# Patient Record
Sex: Female | Born: 1975 | Race: Black or African American | Hispanic: No | Marital: Single | Smoking: Former smoker
Health system: Southern US, Community
[De-identification: ages and names within clinical notes are randomized; demographics above are authoritative.]

## PROBLEM LIST (undated history)

## (undated) ENCOUNTER — Inpatient Hospital Stay (HOSPITAL_COMMUNITY): Payer: Self-pay

## (undated) DIAGNOSIS — IMO0002 Reserved for concepts with insufficient information to code with codable children: Secondary | ICD-10-CM

## (undated) HISTORY — PX: REPAIR IMPERFORATE ANUS / ANORECTOPLASTY: SUR1185

---

## 2007-12-26 ENCOUNTER — Emergency Department (HOSPITAL_COMMUNITY): Admission: EM | Admit: 2007-12-26 | Discharge: 2007-12-26 | Payer: Self-pay | Admitting: Emergency Medicine

## 2008-11-10 HISTORY — PX: KNEE SURGERY: SHX244

## 2009-01-04 ENCOUNTER — Encounter: Admission: RE | Admit: 2009-01-04 | Discharge: 2009-01-04 | Payer: Self-pay | Admitting: Internal Medicine

## 2009-03-22 ENCOUNTER — Encounter (INDEPENDENT_AMBULATORY_CARE_PROVIDER_SITE_OTHER): Payer: Self-pay | Admitting: General Surgery

## 2009-03-22 ENCOUNTER — Ambulatory Visit (HOSPITAL_BASED_OUTPATIENT_CLINIC_OR_DEPARTMENT_OTHER): Admission: RE | Admit: 2009-03-22 | Discharge: 2009-03-22 | Payer: Self-pay | Admitting: General Surgery

## 2010-05-16 ENCOUNTER — Encounter: Admission: RE | Admit: 2010-05-16 | Discharge: 2010-05-16 | Payer: Self-pay | Admitting: Internal Medicine

## 2011-02-18 LAB — DIFFERENTIAL
Basophils Absolute: 0 10*3/uL (ref 0.0–0.1)
Lymphocytes Relative: 23 % (ref 12–46)
Lymphs Abs: 1.6 10*3/uL (ref 0.7–4.0)
Monocytes Relative: 7 % (ref 3–12)
Neutro Abs: 4.8 10*3/uL (ref 1.7–7.7)
Neutrophils Relative %: 70 % (ref 43–77)

## 2011-02-18 LAB — ANAEROBIC CULTURE

## 2011-02-18 LAB — CBC
HCT: 43 % (ref 36.0–46.0)
MCHC: 33.7 g/dL (ref 30.0–36.0)
MCV: 92.5 fL (ref 78.0–100.0)
Platelets: 208 10*3/uL (ref 150–400)
RBC: 4.65 MIL/uL (ref 3.87–5.11)
RDW: 13.7 % (ref 11.5–15.5)
WBC: 6.9 10*3/uL (ref 4.0–10.5)

## 2011-02-18 LAB — WOUND CULTURE: Culture: NO GROWTH

## 2011-03-25 NOTE — Op Note (Signed)
[name], [name] NO.:  [id number]   MEDICAL RECORD NO.:  [id number]          PATIENT TYPE:  AMB   LOCATION:  [facility]                         FACILITY:  [facility]   PHYSICIAN:  Anselm Pancoast. Weatherly, M.D.DATE OF BIRTH:  February 20, 1976   DATE OF PROCEDURE:  03/22/2009  DATE OF DISCHARGE:                               OPERATIVE REPORT   PREOPERATIVE DIAGNOSIS:  Left breast mass, status post drainage of  abscess, left breast approximately three months earlier.   POSTOPERATIVE DIAGNOSIS:  Left breast mass, status post drainage of  abscess, left breast approximately three months earlier.   OPERATION:  Excision of left breast mass with culture.   HISTORY:  [name] is a 35 year old female who I first saw  approximately three months earlier when [facility] was [facility] with a large painful mass with a history that this had been  increasing in size for approximately two weeks.  She had seen her  regular physician who had sent her for a mammogram.  I opened the area  with a small incision at the inferior aspect of the breast and there was  a large amount of kind of creamy fluid.  The cultures grew diphtheroids.  The preliminary cultures did not grow bacteria.  She was on antibiotics.  The area appeared to heal.  The soreness resolved and on the several  follow-up visits, she has never had an obvious abscess but she has had a  little persistent thickening at the approximately 7 o'clock position in  the right breast anteriorly.  I saw her recently and the mass appeared  to be a little more prominent than when I had seen her the month earlier  and she said that yes she would have increasing pain intermittently but  had not had any actual drainage and she of course had not been on  antibiotics.  I recommended that we excise the area since I think it may  be kind of a chronic abscess.  It possibly could be fibrocystic disease  but it is in a very inferior  location and clinically it has not acted  exactly like old fibrocystic disease.  I had given her a prescription  for Keflex which she started orally approximately two nights ago since  the cultures previously were sensitive to the Keflex.  The patient is  here today and on evaluation, the mass is easily felt but may not be  quite as prominent as it was about 10 days ago when she was seen in the  office.   She was given a gram of Ancef and taken to the operative suite.  The  area had been marked on the left side and after induction of general  anesthesia and an LOA tube, the left breast was prepped with Betadine  solution and then I made a little incision actually excising the old  scar.  The underlying breast tissue was kind of separated from the  surrounding breast tissue and on the inferior aspect when we were  dissecting down under the bottom right on the chest wall, there was a  little bit of  inflammatory drainage.  Whether this is kind of like milk-  like fluid or actually abscess fluid, I cannot tell but we sent a  culture aerobically and anaerobically.  I went ahead and completely  excised the area and then the little bleeders in the breast tissue were  coagulated for hemostasis and then the little nodularity right under the  skin was removed and the specimen was sent for permanent exam.  I opened  the area or cut it in half when I was doing the cultures and this  appears to be inflammatory.  It is not really a hard mass like a cancer  and it will be sent for permanent exam.  The wound was thoroughly  irrigated.  I used a couple of 4-0 Vicryl to kind of close the dead  space and then I think I used four interrupted 4-0 Monocryls  subcuticular on the skin and then put three half inch Steri-Strips.  If  there would be a problem of more inflammatory response, I could open the  midportion of the incision but I did not place a drain.  She is a pretty  significant keloid former and we  will see her back in the office next  week.  The procedure was tolerated nicely.  A sterile occlusive dressing  was applied and she will be released after a short stay in the recovery  room.      Anselm Pancoast. Zachery Dakins, M.D.  Electronically Signed     WJW/MEDQ  D:  03/22/2009  T:  03/22/2009  Job:  [id number]   cc:   Candyce Churn. Allyne Gee, M.D.  Fax: [phone]

## 2013-07-03 ENCOUNTER — Inpatient Hospital Stay (HOSPITAL_COMMUNITY)
Admission: AD | Admit: 2013-07-03 | Discharge: 2013-07-03 | Disposition: A | Discharge status: Home / Self Care | Payer: Managed Care, Other (non HMO) | Source: Ambulatory Visit | Attending: Obstetrics and Gynecology | Admitting: Obstetrics and Gynecology

## 2013-07-03 ENCOUNTER — Inpatient Hospital Stay (HOSPITAL_COMMUNITY): Payer: Managed Care, Other (non HMO)

## 2013-07-03 ENCOUNTER — Encounter (HOSPITAL_COMMUNITY): Payer: Self-pay | Admitting: *Deleted

## 2013-07-03 DIAGNOSIS — O209 Hemorrhage in early pregnancy, unspecified: Secondary | ICD-10-CM | POA: Insufficient documentation

## 2013-07-03 DIAGNOSIS — O039 Complete or unspecified spontaneous abortion without complication: Secondary | ICD-10-CM

## 2013-07-03 HISTORY — DX: Reserved for concepts with insufficient information to code with codable children: IMO0002

## 2013-07-03 LAB — WET PREP, GENITAL

## 2013-07-03 LAB — CBC
Hemoglobin: 13.7 g/dL (ref 12.0–15.0)
MCH: 30.6 pg (ref 26.0–34.0)
Platelets: 226 10*3/uL (ref 150–400)
RBC: 4.47 MIL/uL (ref 3.87–5.11)

## 2013-07-03 LAB — URINALYSIS, ROUTINE W REFLEX MICROSCOPIC
Nitrite: NEGATIVE
Protein, ur: 30 mg/dL — AB
pH: 7 (ref 5.0–8.0)

## 2013-07-03 LAB — HCG, QUANTITATIVE, PREGNANCY: hCG, Beta Chain, Quant, S: 14053 m[IU]/mL — ABNORMAL HIGH (ref <5)

## 2013-07-03 NOTE — MAU Provider Note (Signed)
History   37yo, G1P0 at [redacted]w[redacted]d by LMP.  Pt called this morning to report 1 episode of brown bleeding on toilet paper.  Pt agreed to put a pad on and call back if sx got worse or if she started cramping.  Pt called back to report BRB on toilet paper and pad at 2pm and bleeding had continued since.  Pt reports some cramping as well and a small clot that passed the last time she went to the bathroom.   Pregnancy was confirmed at another clinic, NOB w/u 8/25.  Chief Complaint  Patient presents with  . Vaginal Bleeding    OB History   Grav Para Term Preterm Abortions TAB SAB Ect Mult Living   1               Past Medical History  Diagnosis Date  . Hx of colostomy     Past Surgical History  Procedure Laterality Date  . Repair imperforate anus / anorectoplasty      History reviewed. No pertinent family history.  History  Substance Use Topics  . Smoking status: Former Games developer  . Smokeless tobacco: Never Used  . Alcohol Use: No    Allergies: No Known Allergies  Prescriptions prior to admission  Medication Sig Dispense Refill  . Prenatal Vit-Fe Fumarate-FA (PRENATAL MULTIVITAMIN) TABS tablet Take 1 tablet by mouth every morning.        ROS: see HPI above, all other systems are negative   Physical Exam   Blood pressure 150/91, pulse 95, temperature 99.1 F (37.3 C), temperature source Oral, resp. rate 18, last menstrual period 04/16/2013.  Chest: Clear Heart: RRR Abdomen: gravid, NT Extremities: WNL  Pelvic exam: normal external genitalia, vulva, vagina, cervix, uterus and adnexa Vaginal discharge - small to moderate amount of BRB noted in the vaginal vault and appears to be originating from the cervical os.  Cervix is closed and thick  U/S - Enlarged misshapen intrauterine fluid collection could reflect an abnormal intrauterine gestational sac. A gestational sac of this size should demonstrate an embryo if viable. A possible small embryo is noted with discordant  measurement (corresponding with a gestational age of [redacted] weeks 0 day) and no cardiac activity. Findings meet definitive criteria for failed pregnancy.   Results for orders placed during the hospital encounter of 07/03/13 (from the past 24 hour(s))  URINALYSIS, ROUTINE W REFLEX MICROSCOPIC     Status: Abnormal   Collection Time    07/03/13  2:45 PM      Result Value Range   Color, Urine RED (*) YELLOW   APPearance CLOUDY (*) CLEAR   Specific Gravity, Urine 1.020  1.005 - 1.030   pH 7.0  5.0 - 8.0   Glucose, UA NEGATIVE  NEGATIVE mg/dL   Hgb urine dipstick LARGE (*) NEGATIVE   Bilirubin Urine NEGATIVE  NEGATIVE   Ketones, ur NEGATIVE  NEGATIVE mg/dL   Protein, ur 30 (*) NEGATIVE mg/dL   Urobilinogen, UA 0.2  0.0 - 1.0 mg/dL   Nitrite NEGATIVE  NEGATIVE   Leukocytes, UA TRACE (*) NEGATIVE  URINE MICROSCOPIC-ADD ON     Status: None   Collection Time    07/03/13  2:45 PM      Result Value Range   Squamous Epithelial / LPF RARE  RARE   WBC, UA 0-2  <3 WBC/hpf   RBC / HPF TOO NUMEROUS TO COUNT  <3 RBC/hpf  POCT PREGNANCY, URINE     Status: Abnormal   Collection Time  07/03/13  2:54 PM      Result Value Range   Preg Test, Ur POSITIVE (*) NEGATIVE  WET PREP, GENITAL     Status: Abnormal   Collection Time    07/03/13  3:30 PM      Result Value Range   Yeast Wet Prep HPF POC NONE SEEN  NONE SEEN   Trich, Wet Prep NONE SEEN  NONE SEEN   Clue Cells Wet Prep HPF POC RARE (*) NONE SEEN   WBC, Wet Prep HPF POC NONE SEEN  NONE SEEN  CBC     Status: Abnormal   Collection Time    07/03/13  3:44 PM      Result Value Range   WBC 13.6 (*) 4.0 - 10.5 K/uL   RBC 4.47  3.87 - 5.11 MIL/uL   Hemoglobin 13.7  12.0 - 15.0 g/dL   HCT 16.1  09.6 - 04.5 %   MCV 89.3  78.0 - 100.0 fL   MCH 30.6  26.0 - 34.0 pg   MCHC 34.3  30.0 - 36.0 g/dL   RDW 40.9  81.1 - 91.4 %   Platelets 226  150 - 400 K/uL  HCG, QUANTITATIVE, PREGNANCY     Status: Abnormal   Collection Time    07/03/13  3:44 PM       Result Value Range   hCG, Beta Chain, Quant, S 14053 (*) <5 mIU/mL      ED Course  Bleeding in early pregnancy  U/S  Labs  Discussed R&B of expectant management, surgical and medical interventions.  Pt voiced understanding and preferred to proceed with expectant management and keep scheduled appointment at [facility] tomorrow.    Discharge home with bleeding, and infection precautions.  Discussed what to expect and when to call.  F/u tomorrow at the office    Haroldine Laws CNM, MSN 07/03/2013 3:40 PM

## 2013-07-03 NOTE — MAU Note (Signed)
Pt presents with complaints of bright red vaginal bleeding that started this morning as brownish discharge. States the bleeding changed to bright red at 2 today. Denies any cramping

## 2013-07-03 NOTE — Discharge Instructions (Signed)
Miscarriage A miscarriage is the sudden loss of an unborn baby (fetus) before the 20th week of pregnancy. Most miscarriages happen in the first 3 months of pregnancy. Sometimes, it happens before a woman even knows she is pregnant. A miscarriage is also called a "spontaneous miscarriage" or "early pregnancy loss." Having a miscarriage can be an emotional experience. Talk with your caregiver about any questions you may have about miscarrying, the grieving process, and your future pregnancy plans. CAUSES   Problems with the fetal chromosomes that make it impossible for the baby to develop normally. Problems with the baby's genes or chromosomes are most often the result of errors that occur, by chance, as the embryo divides and grows. The problems are not inherited from the parents.  Infection of the cervix or uterus.   Hormone problems.   Problems with the cervix, such as having an incompetent cervix. This is when the tissue in the cervix is not strong enough to hold the pregnancy.   Problems with the uterus, such as an abnormally shaped uterus, uterine fibroids, or congenital abnormalities.   Certain medical conditions.   Smoking, drinking alcohol, or taking illegal drugs.   Trauma.  Often, the cause of a miscarriage is unknown.  SYMPTOMS   Vaginal bleeding or spotting, with or without cramps or pain.  Pain or cramping in the abdomen or lower back.  Passing fluid, tissue, or blood clots from the vagina. DIAGNOSIS  Your caregiver will perform a physical exam. You may also have an ultrasound to confirm the miscarriage. Blood or urine tests may also be ordered. TREATMENT   Sometimes, treatment is not necessary if you naturally pass all the fetal tissue that was in the uterus. If some of the fetus or placenta remains in the body (incomplete miscarriage), tissue left behind may become infected and must be removed. Usually, a dilation and curettage (D and C) procedure is performed.  During a D and C procedure, the cervix is widened (dilated) and any remaining fetal or placental tissue is gently removed from the uterus.  Antibiotic medicines are prescribed if there is an infection. Other medicines may be given to reduce the size of the uterus (contract) if there is a lot of bleeding.  If you have Rh negative blood and your baby was Rh positive, you will need a Rh immunoglobulin shot. This shot will protect any future baby from having Rh blood problems in future pregnancies. HOME CARE INSTRUCTIONS   Your caregiver may order bed rest or may allow you to continue light activity. Resume activity as directed by your caregiver.  Have someone help with home and family responsibilities during this time.   Keep track of the number of sanitary pads you use each day and how soaked (saturated) they are. Write down this information.   Do not use tampons. Do not douche or have sexual intercourse until approved by your caregiver.   Only take over-the-counter or prescription medicines for pain or discomfort as directed by your caregiver.   Do not take aspirin. Aspirin can cause bleeding.   Keep all follow-up appointments with your caregiver.   If you or your partner have problems with grieving, talk to your caregiver or seek counseling to help cope with the pregnancy loss. Allow enough time to grieve before trying to get pregnant again.  SEEK IMMEDIATE MEDICAL CARE IF:   You have severe cramps or pain in your back or abdomen.  You have a fever.  You pass large blood clots (walnut-sized   or larger) ortissue from your vagina. Save any tissue for your caregiver to inspect.   Your bleeding increases.   You have a thick, bad-smelling vaginal discharge.  You become lightheaded, weak, or you faint.   You have chills.  MAKE SURE YOU:  Understand these instructions.  Will watch your condition.  Will get help right away if you are not doing well or get  worse. Document Released: 04/22/2001 Document Revised: 04/27/2012 Document Reviewed: 12/16/2011 Exit[employer] Patient Information 2014 [employer].  

## 2013-07-04 ENCOUNTER — Encounter (HOSPITAL_COMMUNITY): Payer: Self-pay | Admitting: *Deleted

## 2013-07-04 ENCOUNTER — Inpatient Hospital Stay (HOSPITAL_COMMUNITY)
Admission: RE | Admit: 2013-07-04 | Discharge: 2013-07-04 | Disposition: A | Discharge status: Home / Self Care | Payer: Managed Care, Other (non HMO) | Source: Ambulatory Visit | Attending: Obstetrics and Gynecology | Admitting: Obstetrics and Gynecology

## 2013-07-04 ENCOUNTER — Inpatient Hospital Stay (HOSPITAL_COMMUNITY): Payer: Managed Care, Other (non HMO) | Admitting: Anesthesiology

## 2013-07-04 ENCOUNTER — Encounter (HOSPITAL_COMMUNITY)
Admission: RE | Disposition: A | Discharge status: Home / Self Care | Payer: Self-pay | Source: Ambulatory Visit | Attending: Obstetrics and Gynecology

## 2013-07-04 ENCOUNTER — Encounter (HOSPITAL_COMMUNITY): Payer: Self-pay | Admitting: Anesthesiology

## 2013-07-04 DIAGNOSIS — D259 Leiomyoma of uterus, unspecified: Secondary | ICD-10-CM | POA: Insufficient documentation

## 2013-07-04 DIAGNOSIS — O021 Missed abortion: Secondary | ICD-10-CM | POA: Insufficient documentation

## 2013-07-04 HISTORY — PX: DILATION AND EVACUATION: SHX1459

## 2013-07-04 LAB — GC/CHLAMYDIA PROBE AMP: GC Probe RNA: NEGATIVE

## 2013-07-04 SURGERY — DILATION AND EVACUATION, UTERUS
Anesthesia: Monitor Anesthesia Care | Site: Uterus | Wound class: Clean Contaminated

## 2013-07-04 MED ORDER — MIDAZOLAM HCL 2 MG/2ML IJ SOLN
INTRAMUSCULAR | Status: AC
  Filled 2013-07-04: qty 2

## 2013-07-04 MED ORDER — MIDAZOLAM HCL 5 MG/5ML IJ SOLN
INTRAMUSCULAR | Status: DC | PRN
  Administered 2013-07-04: 2 mg via INTRAVENOUS

## 2013-07-04 MED ORDER — PROPOFOL 10 MG/ML IV EMUL
INTRAVENOUS | Status: DC | PRN
  Administered 2013-07-04 (×2): 50 mg via INTRAVENOUS

## 2013-07-04 MED ORDER — PROPOFOL 10 MG/ML IV EMUL
INTRAVENOUS | Status: AC
  Filled 2013-07-04: qty 40

## 2013-07-04 MED ORDER — DEXAMETHASONE SODIUM PHOSPHATE 10 MG/ML IJ SOLN
INTRAMUSCULAR | Status: AC
  Filled 2013-07-04: qty 1

## 2013-07-04 MED ORDER — BUPIVACAINE-EPINEPHRINE 0.5% -1:200000 IJ SOLN
INTRAMUSCULAR | Status: DC | PRN
  Administered 2013-07-04: 10 mL

## 2013-07-04 MED ORDER — PHENYLEPHRINE 40 MCG/ML (10ML) SYRINGE FOR IV PUSH (FOR BLOOD PRESSURE SUPPORT)
PREFILLED_SYRINGE | INTRAVENOUS | Status: AC
  Filled 2013-07-04: qty 5

## 2013-07-04 MED ORDER — PROPOFOL INFUSION 10 MG/ML OPTIME
INTRAVENOUS | Status: DC | PRN
  Administered 2013-07-04: 120 ug/kg/min via INTRAVENOUS

## 2013-07-04 MED ORDER — SILVER NITRATE-POT NITRATE 75-25 % EX MISC
CUTANEOUS | Status: AC
  Filled 2013-07-04: qty 1

## 2013-07-04 MED ORDER — FENTANYL CITRATE 0.05 MG/ML IJ SOLN
INTRAMUSCULAR | Status: AC
  Filled 2013-07-04: qty 2

## 2013-07-04 MED ORDER — 0.9 % SODIUM CHLORIDE (POUR BTL) OPTIME
TOPICAL | Status: DC | PRN
  Administered 2013-07-04: 1000 mL

## 2013-07-04 MED ORDER — FENTANYL CITRATE 0.05 MG/ML IJ SOLN
INTRAMUSCULAR | Status: DC | PRN
  Administered 2013-07-04: 100 ug via INTRAVENOUS
  Administered 2013-07-04: 50 ug via INTRAVENOUS

## 2013-07-04 MED ORDER — KETOROLAC TROMETHAMINE 30 MG/ML IJ SOLN: 15.0000 mg | Freq: Once | INTRAMUSCULAR | Status: DC | PRN

## 2013-07-04 MED ORDER — BUPIVACAINE-EPINEPHRINE (PF) 0.5% -1:200000 IJ SOLN
INTRAMUSCULAR | Status: AC
  Filled 2013-07-04: qty 10

## 2013-07-04 MED ORDER — DEXAMETHASONE SODIUM PHOSPHATE 10 MG/ML IJ SOLN
INTRAMUSCULAR | Status: DC | PRN
  Administered 2013-07-04: 10 mg via INTRAVENOUS

## 2013-07-04 MED ORDER — FENTANYL CITRATE 0.05 MG/ML IJ SOLN: 25.0000 ug | INTRAMUSCULAR | Status: DC | PRN

## 2013-07-04 MED ORDER — LIDOCAINE HCL (CARDIAC) 20 MG/ML IV SOLN
INTRAVENOUS | Status: AC
  Filled 2013-07-04: qty 5

## 2013-07-04 MED ORDER — LIDOCAINE HCL (CARDIAC) 20 MG/ML IV SOLN
INTRAVENOUS | Status: DC | PRN
  Administered 2013-07-04: 40 mg via INTRAVENOUS

## 2013-07-04 MED ORDER — PHENYLEPHRINE HCL 10 MG/ML IJ SOLN
INTRAMUSCULAR | Status: DC | PRN
  Administered 2013-07-04 (×3): 80 ug via INTRAVENOUS
  Administered 2013-07-04: 40 ug via INTRAVENOUS

## 2013-07-04 MED ORDER — KETOROLAC TROMETHAMINE 30 MG/ML IJ SOLN
INTRAMUSCULAR | Status: DC | PRN
  Administered 2013-07-04: 30 mg via INTRAVENOUS
  Administered 2013-07-04: 30 mg via INTRAMUSCULAR

## 2013-07-04 MED ORDER — FENTANYL CITRATE 0.05 MG/ML IJ SOLN
INTRAMUSCULAR | Status: AC
  Filled 2013-07-04: qty 4

## 2013-07-04 MED ORDER — ONDANSETRON HCL 4 MG/2ML IJ SOLN
INTRAMUSCULAR | Status: AC
  Filled 2013-07-04: qty 2

## 2013-07-04 MED ORDER — ONDANSETRON HCL 4 MG/2ML IJ SOLN
INTRAMUSCULAR | Status: DC | PRN
  Administered 2013-07-04: 4 mg via INTRAVENOUS

## 2013-07-04 MED ORDER — ONDANSETRON HCL 4 MG/2ML IJ SOLN: 4.0000 mg | Freq: Once | INTRAMUSCULAR | Status: DC | PRN

## 2013-07-04 MED ORDER — MEPERIDINE HCL 25 MG/ML IJ SOLN: 6.2500 mg | INTRAMUSCULAR | Status: DC | PRN

## 2013-07-04 MED ORDER — LACTATED RINGERS IV SOLN
INTRAVENOUS | Status: DC
  Administered 2013-07-04 (×2): via INTRAVENOUS

## 2013-07-04 SURGICAL SUPPLY — 20 items
CATH ROBINSON RED A/P 16FR (CATHETERS) ×2 IMPLANT
CLOTH BEACON ORANGE TIMEOUT ST (SAFETY) ×2 IMPLANT
DECANTER SPIKE VIAL GLASS SM (MISCELLANEOUS) ×2 IMPLANT
GLOVE BIOGEL PI IND STRL 8.5 (GLOVE) ×1 IMPLANT
GLOVE BIOGEL PI INDICATOR 8.5 (GLOVE) ×1
GLOVE ECLIPSE 8.0 STRL XLNG CF (GLOVE) ×4 IMPLANT
GOWN STRL REIN XL XLG (GOWN DISPOSABLE) ×4 IMPLANT
KIT BERKELEY 1ST TRIMESTER 3/8 (MISCELLANEOUS) ×2 IMPLANT
NEEDLE SPNL 22GX3.5 QUINCKE BK (NEEDLE) ×2 IMPLANT
NS IRRIG 1000ML POUR BTL (IV SOLUTION) ×2 IMPLANT
PACK VAGINAL MINOR WOMEN LF (CUSTOM PROCEDURE TRAY) ×2 IMPLANT
PAD OB MATERNITY 4.3X12.25 (PERSONAL CARE ITEMS) ×2 IMPLANT
PAD PREP 24X48 CUFFED NSTRL (MISCELLANEOUS) ×2 IMPLANT
SET BERKELEY SUCTION TUBING (SUCTIONS) ×2 IMPLANT
SYR CONTROL 10ML LL (SYRINGE) ×2 IMPLANT
TOWEL OR 17X24 6PK STRL BLUE (TOWEL DISPOSABLE) ×4 IMPLANT
VACURETTE 10 RIGID CVD (CANNULA) IMPLANT
VACURETTE 7MM CVD STRL WRAP (CANNULA) IMPLANT
VACURETTE 8 RIGID CVD (CANNULA) ×2 IMPLANT
VACURETTE 9 RIGID CVD (CANNULA) IMPLANT

## 2013-07-04 NOTE — H&P (Signed)
  Admission History and Physical Exam for a Gynecology Patient  [name] is a 37 y.o. female, G1P0, who presents for a D and E in the first trimester because of a missed abortion. She has been followed at the [facility]cology division of [facility]. Nonviability was confirmed by [place].     OB History   Grav Para Term Preterm Abortions TAB SAB Ect Mult Living   1               Past Medical History  Diagnosis Date  . Hx of colostomy     No prescriptions prior to admission    Past Surgical History  Procedure Laterality Date  . Repair imperforate anus / anorectoplasty      No Known Allergies  Family History: family history is not on file.  Social History:  reports that she has quit smoking. She has never used smokeless tobacco. She reports that she does not drink alcohol or use illicit drugs.  Review of systems: See HPI.  Admission Physical Exam:    There is no height or weight on file to calculate BMI.  Last menstrual period 04/16/2013.  HEENT:                 Within normal limits Chest:                   Clear Heart:                    Regular rate and rhythm Breasts:                No masses, skin changes, bleeding, or discharge present Abdomen:             Nontender, no masses Extremities:          Grossly normal Neurologic exam: Grossly normal  Pelvic exam:  External genitalia: normal general appearance Vaginal: normal without tenderness, induration or masses Cervix: normal appearance Adnexa: normal bimanual exam Uterus: enlarged  Assessment:  First trimester missed abortion  Plan:  D and E. R and B discussed.  Blood type O+.   Janine Limbo 07/04/2013

## 2013-07-04 NOTE — Transfer of Care (Signed)
Immediate Anesthesia Transfer of Care Note  Patient: [name]  Procedure(s) Performed: Procedure(s): DILATATION AND EVACUATION (N/A)  Patient Location: PACU  Anesthesia Type:MAC  Level of Consciousness: awake, alert  and oriented  Airway & Oxygen Therapy: Patient Spontanous Breathing and Patient connected to nasal cannula oxygen  Post-op Assessment: Report given to PACU RN and Post -op Vital signs reviewed and stable  Post vital signs: Reviewed and stable  Complications: No apparent anesthesia complications

## 2013-07-04 NOTE — Op Note (Addendum)
OPERATIVE NOTE  [name]  DOB:    September 27, 1976  MRN:    [id number]  CSN:    [id number]  Date of Surgery:  07/04/2013  Preoperative Diagnosis:  Missed Abortion at [redacted]w[redacted]d Gestation  Postoperative Diagnosis:  Missed Abortion at 110w2d Gestation Fibroids  Procedure:  FIRST TRIMESTER SUCTION DILATATION AND EVACUATION  Surgeon:  Leonard Schwartz, M.D.  Assistant:  None  Anesthetic:  MAC  Disposition:  The patient is a 37 y.o. year old female who presents at [redacted]w[redacted]d gestation with a nonviable gestation based on ultrasound and/or quantitative hCG values. She understands the indications for her surgical procedure as well as the alternative treatment options. She accepts the risk of, but not limited to, anesthetic complications, bleeding, infections, and possible damage to the surrounding organs.  Findings:  The patient's blood type is O+. A moderate amount of products of conception were removed from within a eight weeks size uterus. No adnexal masses were appreciated. I suspect that the patient has a two cm submucosal fibroid.  Procedure:  The patient was taken to the operating room where a MAC anesthetic was given. The patient's abdomen, perineum, and vagina were prepped with Betadine. The bladder was drained of urine. The patient was sterilely draped. Examination under anesthesia was performed. Tissue was noted from a partially open cervix. A paracervical block was placed using 10 cc of half percent Marcaine with epinephrine. The cervix was gently dilated. The uterine cavity was evacuated using a size eight suction curet. The cavity was then cleaned using a medium sharp curet. The cavity was felt to be clean at the end of our procedure. The estimated blood loss was 15 cc's. The uterus was reexamined and was noted to be firm. Hemostasis was adequate. Sponge and needle counts were correct. The patient tolerated her but her procedure well. She was awakened from her  anesthetic without difficulty. She was transported to the recovery room in stable condition. The products of conception were sent to pathology.  Followup instructions:  The patient return to see Dr. Stefano Gaul in 2 weeks. She was given prescriptions for pain medications previously. She was given instructions for patients who've undergone the surgical procedure. She will call for questions or concerns.  Leonard Schwartz, M.D.

## 2013-07-04 NOTE — Anesthesia Postprocedure Evaluation (Signed)
Anesthesia Post Note  Patient: [name]  Procedure(s) Performed: Procedure(s) (LRB): DILATATION AND EVACUATION (N/A)  Anesthesia type: MAC  Patient location: PACU  Post pain: Pain level controlled  Post assessment: Post-op Vital signs reviewed  Last Vitals:  Filed Vitals:   07/04/13 1830  BP:   Pulse: 99  Temp:   Resp: 22    Post vital signs: Reviewed  Level of consciousness: sedated  Complications: No apparent anesthesia complications

## 2013-07-04 NOTE — Anesthesia Preprocedure Evaluation (Signed)
Anesthesia Evaluation  Patient identified by MRN, date of birth, ID band Patient awake    Reviewed: Allergy & Precautions, H&P , NPO status , Patient's Chart, lab work & pertinent test results  Airway Mallampati: I TM Distance: >3 FB Neck ROM: full    Dental no notable dental hx.    Pulmonary neg pulmonary ROS,    Pulmonary exam normal       Cardiovascular hypertension,     Neuro/Psych negative neurological ROS  negative psych ROS   GI/Hepatic negative GI ROS, Neg liver ROS,   Endo/Other  negative endocrine ROS  Renal/GU negative Renal ROS     Musculoskeletal negative musculoskeletal ROS (+)   Abdominal Normal abdominal exam  (+)   Peds  Hematology negative hematology ROS (+)   Anesthesia Other Findings   Reproductive/Obstetrics (+) Pregnancy                           Anesthesia Physical Anesthesia Plan  ASA: II  Anesthesia Plan: MAC   Post-op Pain Management:    Induction:   Airway Management Planned:   Additional Equipment:   Intra-op Plan:   Post-operative Plan:   Informed Consent: I have reviewed the patients History and Physical, chart, labs and discussed the procedure including the risks, benefits and alternatives for the proposed anesthesia with the patient or authorized representative who has indicated his/her understanding and acceptance.     Plan Discussed with: CRNA and Surgeon  Anesthesia Plan Comments:         Anesthesia Quick Evaluation

## 2013-07-04 NOTE — Anesthesia Procedure Notes (Signed)
Procedure Name: MAC Date/Time: 07/04/2013 5:42 PM Performed by: Gwendlyn Hanback, Jannet Askew Pre-anesthesia Checklist: Patient identified, Patient being monitored, Emergency Drugs available and Timeout performed Oxygen Delivery Method: Nasal cannula Preoxygenation: Pre-oxygenation with 100% oxygen

## 2013-07-05 ENCOUNTER — Encounter (HOSPITAL_COMMUNITY): Payer: Self-pay | Admitting: Obstetrics and Gynecology

## 2014-05-08 ENCOUNTER — Encounter (HOSPITAL_COMMUNITY): Payer: Self-pay | Admitting: *Deleted

## 2014-09-11 ENCOUNTER — Encounter (HOSPITAL_COMMUNITY): Payer: Self-pay | Admitting: *Deleted

## 2014-10-13 LAB — OB RESULTS CONSOLE HEPATITIS B SURFACE ANTIGEN: Hepatitis B Surface Ag: NEGATIVE

## 2014-10-13 LAB — OB RESULTS CONSOLE GC/CHLAMYDIA
Chlamydia: NEGATIVE
Gonorrhea: NEGATIVE

## 2014-10-13 LAB — OB RESULTS CONSOLE RPR: RPR: NONREACTIVE

## 2014-10-13 LAB — OB RESULTS CONSOLE ABO/RH: RH TYPE: POSITIVE

## 2014-10-13 LAB — OB RESULTS CONSOLE ANTIBODY SCREEN: Antibody Screen: NEGATIVE

## 2014-10-13 LAB — OB RESULTS CONSOLE RUBELLA ANTIBODY, IGM: RUBELLA: IMMUNE

## 2014-10-13 LAB — OB RESULTS CONSOLE HIV ANTIBODY (ROUTINE TESTING): HIV: NONREACTIVE

## 2015-04-27 LAB — OB RESULTS CONSOLE GBS: STREP GROUP B AG: POSITIVE

## 2015-05-21 ENCOUNTER — Inpatient Hospital Stay (HOSPITAL_COMMUNITY)
Admission: AD | Admit: 2015-05-21 | Discharge: 2015-05-26 | DRG: 765 | Disposition: A | Discharge status: Home / Self Care | Payer: Managed Care, Other (non HMO) | Source: Ambulatory Visit | Attending: Obstetrics and Gynecology | Admitting: Obstetrics and Gynecology

## 2015-05-21 ENCOUNTER — Encounter (HOSPITAL_COMMUNITY): Payer: Self-pay | Admitting: *Deleted

## 2015-05-21 DIAGNOSIS — Z823 Family history of stroke: Secondary | ICD-10-CM

## 2015-05-21 DIAGNOSIS — O09529 Supervision of elderly multigravida, unspecified trimester: Secondary | ICD-10-CM | POA: Diagnosis not present

## 2015-05-21 DIAGNOSIS — K632 Fistula of intestine: Secondary | ICD-10-CM | POA: Diagnosis not present

## 2015-05-21 DIAGNOSIS — D259 Leiomyoma of uterus, unspecified: Secondary | ICD-10-CM | POA: Diagnosis present

## 2015-05-21 DIAGNOSIS — Z3A39 39 weeks gestation of pregnancy: Secondary | ICD-10-CM | POA: Diagnosis present

## 2015-05-21 DIAGNOSIS — O99824 Streptococcus B carrier state complicating childbirth: Secondary | ICD-10-CM | POA: Diagnosis present

## 2015-05-21 DIAGNOSIS — Z98891 History of uterine scar from previous surgery: Secondary | ICD-10-CM

## 2015-05-21 DIAGNOSIS — Z6829 Body mass index (BMI) 29.0-29.9, adult: Secondary | ICD-10-CM

## 2015-05-21 DIAGNOSIS — E669 Obesity, unspecified: Secondary | ICD-10-CM | POA: Diagnosis present

## 2015-05-21 DIAGNOSIS — O9081 Anemia of the puerperium: Secondary | ICD-10-CM | POA: Diagnosis not present

## 2015-05-21 DIAGNOSIS — Z87891 Personal history of nicotine dependence: Secondary | ICD-10-CM

## 2015-05-21 DIAGNOSIS — D649 Anemia, unspecified: Secondary | ICD-10-CM | POA: Diagnosis not present

## 2015-05-21 DIAGNOSIS — B951 Streptococcus, group B, as the cause of diseases classified elsewhere: Secondary | ICD-10-CM | POA: Diagnosis present

## 2015-05-21 DIAGNOSIS — O4593 Premature separation of placenta, unspecified, third trimester: Secondary | ICD-10-CM | POA: Diagnosis present

## 2015-05-21 DIAGNOSIS — O99214 Obesity complicating childbirth: Secondary | ICD-10-CM | POA: Diagnosis present

## 2015-05-21 DIAGNOSIS — D219 Benign neoplasm of connective and other soft tissue, unspecified: Secondary | ICD-10-CM | POA: Diagnosis not present

## 2015-05-21 DIAGNOSIS — O1413 Severe pre-eclampsia, third trimester: Secondary | ICD-10-CM | POA: Diagnosis present

## 2015-05-21 DIAGNOSIS — Z6828 Body mass index (BMI) 28.0-28.9, adult: Secondary | ICD-10-CM

## 2015-05-21 DIAGNOSIS — Z87738 Personal history of other specified (corrected) congenital malformations of digestive system: Secondary | ICD-10-CM

## 2015-05-21 DIAGNOSIS — O3413 Maternal care for benign tumor of corpus uteri, third trimester: Secondary | ICD-10-CM | POA: Diagnosis present

## 2015-05-21 DIAGNOSIS — O1410 Severe pre-eclampsia, unspecified trimester: Secondary | ICD-10-CM | POA: Diagnosis present

## 2015-05-21 LAB — CBC
HCT: 36.8 % (ref 36.0–46.0)
HCT: 36.8 % (ref 36.0–46.0)
Hemoglobin: 12 g/dL (ref 12.0–15.0)
Hemoglobin: 12.1 g/dL (ref 12.0–15.0)
MCH: 28.6 pg (ref 26.0–34.0)
MCH: 28.6 pg (ref 26.0–34.0)
MCHC: 32.6 g/dL (ref 30.0–36.0)
MCHC: 32.9 g/dL (ref 30.0–36.0)
MCV: 87 fL (ref 78.0–100.0)
MCV: 87.6 fL (ref 78.0–100.0)
Platelets: 176 10*3/uL (ref 150–400)
Platelets: 191 10*3/uL (ref 150–400)
RBC: 4.2 MIL/uL (ref 3.87–5.11)
RBC: 4.23 MIL/uL (ref 3.87–5.11)
RDW: 15.4 % (ref 11.5–15.5)
RDW: 15.6 % — ABNORMAL HIGH (ref 11.5–15.5)
WBC: 13.7 10*3/uL — ABNORMAL HIGH (ref 4.0–10.5)
WBC: 17.6 10*3/uL — ABNORMAL HIGH (ref 4.0–10.5)

## 2015-05-21 LAB — URINALYSIS, ROUTINE W REFLEX MICROSCOPIC
Bilirubin Urine: NEGATIVE
Glucose, UA: NEGATIVE mg/dL
Ketones, ur: NEGATIVE mg/dL
NITRITE: NEGATIVE
Protein, ur: NEGATIVE mg/dL
Urobilinogen, UA: 0.2 mg/dL (ref 0.0–1.0)
pH: 5.5 (ref 5.0–8.0)

## 2015-05-21 LAB — PROTEIN / CREATININE RATIO, URINE
CREATININE, URINE: 26 mg/dL
Total Protein, Urine: <6 mg/dL

## 2015-05-21 LAB — COMPREHENSIVE METABOLIC PANEL
ALK PHOS: 261 U/L — AB (ref 38–126)
ALT: 25 U/L (ref 14–54)
AST: 37 U/L (ref 15–41)
Albumin: 2.9 g/dL — ABNORMAL LOW (ref 3.5–5.0)
Anion gap: 7 (ref 5–15)
BUN: 15 mg/dL (ref 6–20)
CO2: 18 mmol/L — ABNORMAL LOW (ref 22–32)
Calcium: 9.2 mg/dL (ref 8.9–10.3)
Chloride: 110 mmol/L (ref 101–111)
Creatinine, Ser: 1.3 mg/dL — ABNORMAL HIGH (ref 0.44–1.00)
GFR calc non Af Amer: 51 mL/min — ABNORMAL LOW (ref >60)
GFR, EST AFRICAN AMERICAN: 59 mL/min — AB (ref >60)
Glucose, Bld: 80 mg/dL (ref 65–99)
POTASSIUM: 4.2 mmol/L (ref 3.5–5.1)
SODIUM: 135 mmol/L (ref 135–145)
Total Bilirubin: 0.5 mg/dL (ref 0.3–1.2)
Total Protein: 6.4 g/dL — ABNORMAL LOW (ref 6.5–8.1)

## 2015-05-21 LAB — LACTATE DEHYDROGENASE: LDH: 237 U/L — ABNORMAL HIGH (ref 98–192)

## 2015-05-21 LAB — URINE MICROSCOPIC-ADD ON

## 2015-05-21 LAB — URIC ACID: Uric Acid, Serum: 8.1 mg/dL — ABNORMAL HIGH (ref 2.3–6.6)

## 2015-05-21 MED ORDER — LACTATED RINGERS IV SOLN
INTRAVENOUS | Status: DC
  Administered 2015-05-21 – 2015-05-23 (×4): via INTRAVENOUS

## 2015-05-21 MED ORDER — TERBUTALINE SULFATE 1 MG/ML IJ SOLN: 0.2500 mg | Freq: Once | INTRAMUSCULAR | Status: DC | PRN

## 2015-05-21 MED ORDER — HYDRALAZINE HCL 20 MG/ML IJ SOLN: 10.0000 mg | Freq: Once | INTRAMUSCULAR | Status: DC | PRN

## 2015-05-21 MED ORDER — OXYCODONE-ACETAMINOPHEN 5-325 MG PO TABS: 2.0000 | ORAL_TABLET | ORAL | Status: DC | PRN

## 2015-05-21 MED ORDER — HYDRALAZINE HCL 20 MG/ML IJ SOLN: 10.0000 mg | Freq: Once | INTRAMUSCULAR | Status: AC | PRN

## 2015-05-21 MED ORDER — PENICILLIN G POTASSIUM 5000000 UNITS IJ SOLR
5.0000 10*6.[IU] | Freq: Once | INTRAVENOUS | Status: AC
  Administered 2015-05-21: 5 10*6.[IU] via INTRAVENOUS
  Filled 2015-05-21: qty 5

## 2015-05-21 MED ORDER — ONDANSETRON HCL 4 MG/2ML IJ SOLN
4.0000 mg | Freq: Four times a day (QID) | INTRAMUSCULAR | Status: DC | PRN
  Administered 2015-05-23: 4 mg via INTRAVENOUS

## 2015-05-21 MED ORDER — FENTANYL CITRATE (PF) 100 MCG/2ML IJ SOLN
100.0000 ug | INTRAMUSCULAR | Status: DC | PRN
Start: 2015-05-21 — End: 2015-05-23
  Administered 2015-05-22 (×3): 100 ug via INTRAVENOUS
  Filled 2015-05-21 (×3): qty 2

## 2015-05-21 MED ORDER — LACTATED RINGERS IV SOLN
INTRAVENOUS | Status: DC
  Administered 2015-05-21 – 2015-05-23 (×2): via INTRAVENOUS

## 2015-05-21 MED ORDER — LACTATED RINGERS IV SOLN
1.0000 g/h | INTRAVENOUS | Status: DC
  Administered 2015-05-22: 1 g/h via INTRAVENOUS
  Filled 2015-05-21 (×2): qty 80

## 2015-05-21 MED ORDER — ACETAMINOPHEN 325 MG PO TABS: 650.0000 mg | ORAL_TABLET | ORAL | Status: DC | PRN

## 2015-05-21 MED ORDER — FLEET ENEMA 7-19 GM/118ML RE ENEM: 1.0000 | ENEMA | RECTAL | Status: DC | PRN

## 2015-05-21 MED ORDER — MISOPROSTOL 25 MCG QUARTER TABLET: 25.0000 ug | ORAL_TABLET | ORAL | Status: DC | PRN

## 2015-05-21 MED ORDER — TERBUTALINE SULFATE 1 MG/ML IJ SOLN: 0.2500 mg | Freq: Once | INTRAMUSCULAR | Status: AC | PRN

## 2015-05-21 MED ORDER — HYDRALAZINE HCL 20 MG/ML IJ SOLN
10.0000 mg | Freq: Once | INTRAMUSCULAR | Status: AC | PRN
  Administered 2015-05-21: 10 mg via INTRAVENOUS
  Filled 2015-05-21: qty 1

## 2015-05-21 MED ORDER — MAGNESIUM SULFATE BOLUS VIA INFUSION
4.0000 g | Freq: Once | INTRAVENOUS | Status: AC
Start: 2015-05-21 — End: 2015-05-21
  Administered 2015-05-21: 4 g via INTRAVENOUS
  Filled 2015-05-21: qty 500

## 2015-05-21 MED ORDER — OXYTOCIN BOLUS FROM INFUSION: 500.0000 mL | INTRAVENOUS | Status: DC

## 2015-05-21 MED ORDER — CITRIC ACID-SODIUM CITRATE 334-500 MG/5ML PO SOLN
30.0000 mL | ORAL | Status: DC | PRN
  Administered 2015-05-23: 30 mL via ORAL
  Filled 2015-05-21: qty 15

## 2015-05-21 MED ORDER — OXYTOCIN 40 UNITS IN LACTATED RINGERS INFUSION - SIMPLE MED
1.0000 m[IU]/min | INTRAVENOUS | Status: DC
  Administered 2015-05-21: 1 m[IU]/min via INTRAVENOUS
  Filled 2015-05-21: qty 1000

## 2015-05-21 MED ORDER — LABETALOL HCL 5 MG/ML IV SOLN
20.0000 mg | INTRAVENOUS | Status: AC | PRN
  Administered 2015-05-21: 20 mg via INTRAVENOUS
  Administered 2015-05-21: 80 mg via INTRAVENOUS
  Administered 2015-05-21: 40 mg via INTRAVENOUS
  Filled 2015-05-21: qty 16
  Filled 2015-05-21: qty 8
  Filled 2015-05-21: qty 4

## 2015-05-21 MED ORDER — LIDOCAINE HCL (PF) 1 % IJ SOLN: 30.0000 mL | INTRAMUSCULAR | Status: DC | PRN

## 2015-05-21 MED ORDER — OXYTOCIN 40 UNITS IN LACTATED RINGERS INFUSION - SIMPLE MED
62.5000 mL/h | INTRAVENOUS | Status: DC
  Filled 2015-05-21: qty 1000

## 2015-05-21 MED ORDER — OXYCODONE-ACETAMINOPHEN 5-325 MG PO TABS: 1.0000 | ORAL_TABLET | ORAL | Status: DC | PRN

## 2015-05-21 MED ORDER — PENICILLIN G POTASSIUM 5000000 UNITS IJ SOLR
2.5000 10*6.[IU] | INTRAVENOUS | Status: DC
  Administered 2015-05-22 – 2015-05-23 (×9): 2.5 10*6.[IU] via INTRAVENOUS
  Filled 2015-05-21 (×13): qty 2.5

## 2015-05-21 MED ORDER — LACTATED RINGERS IV SOLN
500.0000 mL | INTRAVENOUS | Status: DC | PRN
  Administered 2015-05-23: 500 mL via INTRAVENOUS

## 2015-05-21 MED ORDER — LABETALOL HCL 5 MG/ML IV SOLN: 20.0000 mg | INTRAVENOUS | Status: DC | PRN

## 2015-05-21 NOTE — MAU Note (Signed)
Patient presents at [redacted] weeks gestation with c/o contractions 3-5 minutes apart since 1500 today. Fetus active. Denies bleeding but has seen bloody show.

## 2015-05-21 NOTE — Progress Notes (Addendum)
  Subjective: Uncomfortable w/ ctxs, but bearable. Declines the need for pain medication at this time. Thinks she may have SROM'd this morning at 7 am.  Denies h/a, visual changes, epigastric pain or difficulty breathing.  Mother and significant other at bedside.   Objective: BP 138/83 mmHg  Pulse 101  Temp(Src) 98.1 F (36.7 C) (Oral)  Resp 18  Ht 5\' 5"  (1.651 m)  Wt 78.926 kg (174 lb)  BMI 28.96 kg/m2  LMP 08/18/2014     Today's Vitals   05/21/15 2002 05/21/15 2056 05/21/15 2105 05/21/15 2126  BP: 139/79  138/83 149/86  Pulse: 95  101 105  Temp: 98.1 F (36.7 C)     TempSrc: Oral     Resp: 18  18 18   Height:      Weight:      PainSc:  8   8    FHT: BL 140s w/ moderate variability, 10x10 accels, no decels UC:   Irregular -- started out q 2-6 min, now more spaced out SVE:   Dilation: 3 Effacement (%): 50 Station: -3 Exam by:: Farrel Gordon, CNM  Bishop score: 2 Cephalic by VE; sutures palpable Pelvis feels adequate: EFW: 7 1/2 lbs  Assessment:  39 yo G2P0010 @ 39.3 wks  IOL due to preE with severe features Stable BPs currently; has not needed coverage since 1847  GBS positive AMA Early labor ? ROM x 14+ hrs; no evidence of infection PCR too low to calculate  Plan: Consulted Dr. Cletis Media. Will begin 1) Pitocin, 2) Magnesium Sulfate, and 3) PCN G for GBS prophylaxis. Mode of induction reiterated. Vigilant for placenta abruption. Continue Magnesium Sulfate throughout labor until 24 hrs after delivery. Anticipate progress and SVD. Consult prn. Pain med/epidural as desired.  Farrel Gordon CNM 05/21/2015, 9:27 PM

## 2015-05-21 NOTE — H&P (Signed)
[name] is a 39 y.o. female, G2P0010 at 54 3/7 weeks, presenting for contractions of increasing frequency and intensity throughout the day, now q 5 min, with bloody show.Denies HA, visual sx, or epigastric pain, reports +FM.No recent cervical exam.   Patient Active Problem List   Diagnosis Date Noted  . Preeclampsia with severe features 05/21/2015  . Positive GBS test 05/21/2015  . AMA (advanced maternal age) multigravida 35+ 05/21/2015  . Fibroids 05/21/2015  . History of imperforate anus--surgery 1976 05/21/2015  . Fistula of intestine--repaired 2007 05/21/2015                                                                                          History of present pregnancy: Patient entered care at 10.5 weeks.  EDC of 05/25/15 was established by u/s at 11.6 wks.  Anatomy scan: 20.0 weeks, Boy. Vertex. Anterior. Vertical pocket 4.3 cm. FIBROIDS SEEN INTRAMURAL SIZES 2CM TO 5CM. LOW LYING PLACENTA 0.96 CM FROM OS. RVOT not well seen d/t position.  Additional [place] evaluations: Dating U/S. Singleton IUP with + FHT's. Anteverted Ut. 2 intramural fibroids are seen. 1.) L anterior 3.9cm x 3.3cm x 3.4 cm 2.) L anterior 3.1 cm x 2.7 cm x 2.3 cm . Note: There is a [place] L uterine cavity measuring 3.0 cm x 1.7 cm x 1.9 cm. Pt reports no bleeding. CRL = GA of [redacted]w[redacted]d. This is 8 days greater than LMP GA and by ACOG dating criteria, EDD is best by U/S. EDD is 05/25/15. Ovaries and adnexas wnl.  12.5 wks - first trimester screen: Singleton pregnancy. Anteverted uterus. Placenta appears anterior. NT = 1.1 mm, amnion seen. Adnexas WNLs. Fibroids -- two left anterior. Sherrill = 3.0 cm x 1.7 cm x 1.9 cm. Technically difficult scan due to abdominal scars. 23.6 wks - f/u anatomy: Single. Vertex. Anterior placenta - Placenta is 4.7 cm from internal OS normal. Fluid is normal. Vertical pocket 4.3 cm. Cx closed.l [place] WNL. RVOT seen Anatomy complete.  Fibroids unchanged - see  previous report for measurements . 37.6 wks - BPP/presentation: Singleton pregnancy. Vertex presentation. Anterior placenta. AFI normal - 50th%tile. BPP 8/8 in 1 minute. Adnexas unremarkable. Previously seen fibroids cannot be seen on today's scan. Significant prenatal events:Fibroids/SCH noted on dating scan; EDD changed. First trimester screen ordered due to h/o imperferated anus. Sinus issues in 2nd trimester. Low lying placenta on anatomy scan - resolved. Fall at 23.6 wks - no significant findings/issues in relationship. Normal pregnancy discomforts in 3rd trimester. Received Tdap on 03/29/15. AMA - BPPs WNL. Transverse lie at 36.6 wks, vtx 1 wk later; remained vtx. 17-lb TWG.  Last evaluation:Office 05/18/15 @ 39.0 wks by SDJ, CNM. Vertex. FHR 130 bpm. BP 130/80. Wt: 174 lbs. S<D -- FH 36 cm.  OB History    Gravida Para Term Preterm AB TAB SAB Ectopic Multiple Living   2    1  1    0    2014--SAB at 10 3/7 weeks, D&C  Past Medical History  Diagnosis Date  . Hx of colostomy    Past Surgical History  Procedure Laterality Date  . Repair imperforate anus /  anorectoplasty    . Dilation and evacuation N/A 07/04/2013    Procedure: DILATATION AND EVACUATION; Surgeon: Ena Dawley, MD; Location: [facility]; Service: Gynecology; Laterality: N/A;   Family History: family history includes Cancer in her maternal grandmother; Heart disease in her maternal grandfather; Hypertension in her maternal grandfather, maternal grandmother, mother, and paternal grandfather; Stroke in her paternal grandfather.   Social History:  reports that she has quit smoking. She has never used smokeless tobacco. She reports that she does not drink alcohol or use illicit drugs. Patient is Sales promotion account executive, of the [employer], has some college, employed as Investment banker, operational, single, with FOB involved and supportive. She is also accompanied by her mother.     Prenatal Transfer Tool  Maternal Diabetes: No Genetic Screening: Normal Panorama, 1st trimester screen, and AFP Maternal Ultrasounds/Referrals: Normal except for fibroids Fetal Ultrasounds or other Referrals: None Maternal Substance Abuse: No Significant Maternal Medications:PNVs Significant Maternal Lab Results: Lab values include: Group B Strep positive  TDAP 03/29/15 Flu NA  ROS: UCs, +FM  No Known Allergies   Dilation: Closed Effacement (%): Thick Exam by:: Elder Negus, CNM Blood pressure 134/92, pulse 98, temperature 98.1 F (36.7 C), temperature source Oral, resp. rate 18, height 5\' 5"  (1.651 m), weight 78.926 kg (174 lb), last menstrual period 08/18/2014, unknown if currently breastfeeding.  Chest clear Heart RRR without murmur Abd gravid, NT, FH 37 cm Pelvic: As above, with small amount dark bloody show. Vtx to bedside US Ext: DTR 2+, 1+ edema, 2 beats clonus bilaterally.  FHR: Category 1 UCs: q 4-5 min, mild.  Prenatal labs: ABO, Rh: O positive (10/13/14)  Antibody:  Neg (10/13/14) Rubella:Immune (10/13/14)   RPR: NR (10/13/14)   HBsAg: Neg (10/13/14) HIV: NR (10/13/14)  GBS: Positive (04/27/15)  Sickle cell/Hgb electrophoresis:No record Pap:Neg on 08/29/14 GC:Neg on 10/13/14 Chlamydia:Neg on 10/13/14 Genetic screenings:Neg first trimester. Low risk Panorama on 11/09/14. Neg AFP. Glucola:Normal at 104 Other:>100K enterococcus species - treated w/ Macrobid, normal Vit D Hgb 12.2 at NOB, 11.3 at 28 weeks  Assessment/Plan: IUP at 39 3/7 weeks Early vs prodromal labor PreE GBS positive AMA Hx significant abdominal surgery as infant Intramural fibroids   Plan: Admit to [place] per consult with Dr. Cletis Media. Labetalol IV x 3 doses, then 10 mg Apresoline given in MAU as Labetalol ineffective. Plan recheck of cervix after admission to [facility] no change, plan initiation of induction, with method depending on maternal/fetal  status at that time. Reviewed plan of care with patient and family--advised them if BP was unable to be controlled, or if maternal/fetal status deteriorated, C/S may be necessary, although our goal is vaginal delivery. Patient and family seem to understand the situation and are agreeable with the plan of care.   Farrel Gordon CNM, MS 05/21/2015, 7:05 PM     ADDENDUM:  Results for orders placed or performed during the hospital encounter of 05/21/15 (from the past 24 hour(s))  Protein / creatinine ratio, urine     Status: None   Collection Time: 05/21/15  4:55 PM  Result Value Ref Range   Creatinine, Urine 26.00 mg/dL   Total Protein, Urine <6 mg/dL   Protein Creatinine Ratio        0.00 - 0.15 mg/mg[Cre]  Urinalysis, Routine w reflex microscopic (not at [facility])     Status: Abnormal   Collection Time: 05/21/15  4:55 PM  Result Value Ref Range   Color, Urine YELLOW YELLOW   APPearance CLEAR CLEAR  Specific Gravity, Urine <1.005 (L) 1.005 - 1.030   pH 5.5 5.0 - 8.0   Glucose, UA NEGATIVE NEGATIVE mg/dL   Hgb urine dipstick LARGE (A) NEGATIVE   Bilirubin Urine NEGATIVE NEGATIVE   Ketones, ur NEGATIVE NEGATIVE mg/dL   Protein, ur NEGATIVE NEGATIVE mg/dL   Urobilinogen, UA 0.2 0.0 - 1.0 mg/dL   Nitrite NEGATIVE NEGATIVE   Leukocytes, UA SMALL (A) NEGATIVE  Urine microscopic-add on     Status: Abnormal   Collection Time: 05/21/15  4:55 PM  Result Value Ref Range   Squamous Epithelial / LPF RARE RARE   WBC, UA 0-2 <3 WBC/hpf   RBC / HPF 0-2 <3 RBC/hpf   Bacteria, UA FEW (A) RARE  Comprehensive metabolic panel     Status: Abnormal   Collection Time: 05/21/15  5:46 PM  Result Value Ref Range   Sodium 135 135 - 145 mmol/L   Potassium 4.2 3.5 - 5.1 mmol/L   Chloride 110 101 - 111 mmol/L   CO2 18 (L) 22 - 32 mmol/L   Glucose, Bld 80 65 - 99 mg/dL   BUN 15 6 - 20 mg/dL   Creatinine, Ser 1.30 (H) 0.44 - 1.00 mg/dL   Calcium 9.2 8.9 - 10.3 mg/dL   Total Protein 6.4 (L) 6.5 - 8.1  g/dL   Albumin 2.9 (L) 3.5 - 5.0 g/dL   AST 37 15 - 41 U/L   ALT 25 14 - 54 U/L   Alkaline Phosphatase 261 (H) 38 - 126 U/L   Total Bilirubin 0.5 0.3 - 1.2 mg/dL   GFR calc non Af Amer 51 (L) >60 mL/min   GFR calc Af Amer 59 (L) >60 mL/min   Anion gap 7 5 - 15  CBC     Status: Abnormal   Collection Time: 05/21/15  5:46 PM  Result Value Ref Range   WBC 13.7 (H) 4.0 - 10.5 K/uL   RBC 4.20 3.87 - 5.11 MIL/uL   Hemoglobin 12.0 12.0 - 15.0 g/dL   HCT 36.8 36.0 - 46.0 %   MCV 87.6 78.0 - 100.0 fL   MCH 28.6 26.0 - 34.0 pg   MCHC 32.6 30.0 - 36.0 g/dL   RDW 15.4 11.5 - 15.5 %   Platelets 191 150 - 400 K/uL  Lactate dehydrogenase     Status: Abnormal   Collection Time: 05/21/15  5:46 PM  Result Value Ref Range   LDH 237 (H) 98 - 192 U/L  Uric acid     Status: Abnormal   Collection Time: 05/21/15  5:46 PM  Result Value Ref Range   Uric Acid, Serum 8.1 (H) 2.3 - 6.6 mg/dL   Pt meets criteria for preE with severe features. Consulted Dr. Cletis Media. Will proceed w/ 1) Cytotec induction, 2) Repeat labs in 6 hrs; if normal, repeat q 12 hrs, and 3) Begin Magnesium Sulfate when in labor.  Will also begin PCN for GBS prophylaxis with SROM or active labor, whichever comes first.  Farrel Gordon, CNM 05/21/15, 07:24 PM

## 2015-05-21 NOTE — MAU Note (Signed)
Urine in lab 

## 2015-05-22 ENCOUNTER — Inpatient Hospital Stay (HOSPITAL_COMMUNITY): Payer: Managed Care, Other (non HMO) | Admitting: Anesthesiology

## 2015-05-22 LAB — CBC
HCT: 37.4 % (ref 36.0–46.0)
HEMATOCRIT: 37.4 % (ref 36.0–46.0)
HEMOGLOBIN: 12.2 g/dL (ref 12.0–15.0)
Hemoglobin: 12.5 g/dL (ref 12.0–15.0)
MCH: 29.2 pg (ref 26.0–34.0)
MCH: 28.6 pg (ref 26.0–34.0)
MCHC: 33.4 g/dL (ref 30.0–36.0)
MCHC: 32.6 g/dL (ref 30.0–36.0)
MCV: 87.4 fL (ref 78.0–100.0)
MCV: 87.6 fL (ref 78.0–100.0)
Platelets: 181 10*3/uL (ref 150–400)
Platelets: 191 10*3/uL (ref 150–400)
RBC: 4.28 MIL/uL (ref 3.87–5.11)
RBC: 4.27 MIL/uL (ref 3.87–5.11)
RDW: 15.6 % — ABNORMAL HIGH (ref 11.5–15.5)
RDW: 15.7 % — ABNORMAL HIGH (ref 11.5–15.5)
WBC: 22.6 10*3/uL — ABNORMAL HIGH (ref 4.0–10.5)
WBC: 21.1 10*3/uL — ABNORMAL HIGH (ref 4.0–10.5)

## 2015-05-22 LAB — COMPREHENSIVE METABOLIC PANEL
ALT: 23 U/L (ref 14–54)
ANION GAP: 5 (ref 5–15)
AST: 36 U/L (ref 15–41)
Albumin: 2.8 g/dL — ABNORMAL LOW (ref 3.5–5.0)
Alkaline Phosphatase: 244 U/L — ABNORMAL HIGH (ref 38–126)
BUN: 13 mg/dL (ref 6–20)
CALCIUM: 8.7 mg/dL — AB (ref 8.9–10.3)
CHLORIDE: 110 mmol/L (ref 101–111)
CO2: 18 mmol/L — AB (ref 22–32)
CREATININE: 1.32 mg/dL — AB (ref 0.44–1.00)
GFR calc Af Amer: 58 mL/min — ABNORMAL LOW (ref >60)
GFR calc non Af Amer: 50 mL/min — ABNORMAL LOW (ref >60)
Glucose, Bld: 97 mg/dL (ref 65–99)
Potassium: 3.9 mmol/L (ref 3.5–5.1)
Sodium: 133 mmol/L — ABNORMAL LOW (ref 135–145)
Total Bilirubin: 0.6 mg/dL (ref 0.3–1.2)
Total Protein: 6.5 g/dL (ref 6.5–8.1)

## 2015-05-22 LAB — RPR: RPR Ser Ql: NONREACTIVE

## 2015-05-22 LAB — MAGNESIUM
MAGNESIUM: 6.9 mg/dL — AB (ref 1.7–2.4)
Magnesium: 8.6 mg/dL (ref 1.7–2.4)

## 2015-05-22 LAB — LACTATE DEHYDROGENASE: LDH: 188 U/L (ref 98–192)

## 2015-05-22 LAB — HIV ANTIBODY (ROUTINE TESTING W REFLEX): HIV SCREEN 4TH GENERATION: NONREACTIVE

## 2015-05-22 LAB — TYPE AND SCREEN
ABO/RH(D): O POS
Antibody Screen: NEGATIVE

## 2015-05-22 LAB — ABO/RH: ABO/RH(D): O POS

## 2015-05-22 LAB — URIC ACID: URIC ACID, SERUM: 7.9 mg/dL — AB (ref 2.3–6.6)

## 2015-05-22 MED ORDER — DIPHENHYDRAMINE HCL 50 MG/ML IJ SOLN: 12.5000 mg | INTRAMUSCULAR | Status: DC | PRN

## 2015-05-22 MED ORDER — EPHEDRINE 5 MG/ML INJ: 10.0000 mg | INTRAVENOUS | Status: DC | PRN

## 2015-05-22 MED ORDER — OXYTOCIN 40 UNITS IN LACTATED RINGERS INFUSION - SIMPLE MED
1.0000 m[IU]/min | INTRAVENOUS | Status: DC
Start: 2015-05-22 — End: 2015-05-23
  Administered 2015-05-22: 2 m[IU]/min via INTRAVENOUS

## 2015-05-22 MED ORDER — LIDOCAINE HCL (PF) 1 % IJ SOLN
INTRAMUSCULAR | Status: DC | PRN
  Administered 2015-05-22 (×2): 4 mL

## 2015-05-22 MED ORDER — PHENYLEPHRINE 40 MCG/ML (10ML) SYRINGE FOR IV PUSH (FOR BLOOD PRESSURE SUPPORT)
PREFILLED_SYRINGE | INTRAVENOUS | Status: AC
  Filled 2015-05-22: qty 20

## 2015-05-22 MED ORDER — FENTANYL 2.5 MCG/ML BUPIVACAINE 1/10 % EPIDURAL INFUSION (WH - ANES)
14.0000 mL/h | INTRAMUSCULAR | Status: DC | PRN
  Administered 2015-05-22 – 2015-05-23 (×4): 14 mL/h via EPIDURAL
  Filled 2015-05-22 (×2): qty 125

## 2015-05-22 MED ORDER — LACTATED RINGERS IV SOLN
INTRAVENOUS | Status: DC
  Administered 2015-05-22: 300 mL via INTRAUTERINE

## 2015-05-22 MED ORDER — PHENYLEPHRINE 40 MCG/ML (10ML) SYRINGE FOR IV PUSH (FOR BLOOD PRESSURE SUPPORT): 80.0000 ug | PREFILLED_SYRINGE | INTRAVENOUS | Status: DC | PRN

## 2015-05-22 MED ORDER — FENTANYL 2.5 MCG/ML BUPIVACAINE 1/10 % EPIDURAL INFUSION (WH - ANES)
INTRAMUSCULAR | Status: DC
Start: 2015-05-22 — End: 2015-05-22
  Filled 2015-05-22: qty 125

## 2015-05-22 NOTE — Progress Notes (Addendum)
Subjective: Able to get some rest s/p IV pain med. Mother and SO at bedside.  Denies severe h/a, visual changes, epigastric pain or difficulty breathing.  Denies Magnesium Sulfate toxicity sxs -- double or blurred vision, nausea, flushing, warmth, somnolence, slurred speech, weakness, CP or SOB.  Objective: BP 125/85 mmHg  Pulse 89  Temp(Src) 97.3 F (36.3 C) (Oral)  Resp 18  Ht 5\' 5"  (1.651 m)  Wt 78.926 kg (174 lb)  BMI 28.96 kg/m2  LMP 08/18/2014    Today's Vitals   05/22/15 0300 05/22/15 0312 05/22/15 0330 05/22/15 0350  BP: 126/83  125/85   Pulse: 90  89   Temp:    97.3 F (36.3 C)  TempSrc:    Oral  Resp: 18  18   Height:      Weight:      PainSc:  8      Tmax 98.1  BPs since last IV hypertensive med at 1847 = 120s-150s/80s-100s - no coverage required since that time.  Total I/O In: 2032.2 [P.O.:540; I.V.:1142.2; IV Piggyback:350] Out: 570 [Urine:570]  2+DTRs bilaterally FHT: BL 130 w/ min variability, no accels, mild, intermittent variables UC:   irregular, every 2-5 minutes SVE:   Dilation: 3 Effacement (%): 50 Station: -3 Exam by:: Jimmye Norman, CNM Pitocin at 15 mU/min  4 gm Magnesium Sulfate bolus started at 2208 PM 2 gm Magnesium Sulfate started at 2229 PM  PCN 5 million units at 2120 2.5 million units at 0107  FSE placed at 03:41 - no fluid return IUPC placed at 03:43 Results for orders placed or performed during the hospital encounter of 05/21/15 (from the past 24 hour(s))  Protein / creatinine ratio, urine     Status: None   Collection Time: 05/21/15  4:55 PM  Result Value Ref Range   Creatinine, Urine 26.00 mg/dL   Total Protein, Urine <6 mg/dL   Protein Creatinine Ratio        0.00 - 0.15 mg/mg[Cre]  Urinalysis, Routine w reflex microscopic (not at [facility])     Status: Abnormal   Collection Time: 05/21/15  4:55 PM  Result Value Ref Range   Color, Urine YELLOW YELLOW   APPearance CLEAR CLEAR   Specific Gravity, Urine <1.005 (L) 1.005 -  1.030   pH 5.5 5.0 - 8.0   Glucose, UA NEGATIVE NEGATIVE mg/dL   Hgb urine dipstick LARGE (A) NEGATIVE   Bilirubin Urine NEGATIVE NEGATIVE   Ketones, ur NEGATIVE NEGATIVE mg/dL   Protein, ur NEGATIVE NEGATIVE mg/dL   Urobilinogen, UA 0.2 0.0 - 1.0 mg/dL   Nitrite NEGATIVE NEGATIVE   Leukocytes, UA SMALL (A) NEGATIVE  Urine microscopic-add on     Status: Abnormal   Collection Time: 05/21/15  4:55 PM  Result Value Ref Range   Squamous Epithelial / LPF RARE RARE   WBC, UA 0-2 <3 WBC/hpf   RBC / HPF 0-2 <3 RBC/hpf   Bacteria, UA FEW (A) RARE  Comprehensive metabolic panel     Status: Abnormal   Collection Time: 05/21/15  5:46 PM  Result Value Ref Range   Sodium 135 135 - 145 mmol/L   Potassium 4.2 3.5 - 5.1 mmol/L   Chloride 110 101 - 111 mmol/L   CO2 18 (L) 22 - 32 mmol/L   Glucose, Bld 80 65 - 99 mg/dL   BUN 15 6 - 20 mg/dL   Creatinine, Ser 1.30 (H) 0.44 - 1.00 mg/dL   Calcium 9.2 8.9 - 10.3 mg/dL   Total Protein 6.4 (  L) 6.5 - 8.1 g/dL   Albumin 2.9 (L) 3.5 - 5.0 g/dL   AST 37 15 - 41 U/L   ALT 25 14 - 54 U/L   Alkaline Phosphatase 261 (H) 38 - 126 U/L   Total Bilirubin 0.5 0.3 - 1.2 mg/dL   GFR calc non Af Amer 51 (L) >60 mL/min   GFR calc Af Amer 59 (L) >60 mL/min   Anion gap 7 5 - 15  CBC     Status: Abnormal   Collection Time: 05/21/15  5:46 PM  Result Value Ref Range   WBC 13.7 (H) 4.0 - 10.5 K/uL   RBC 4.20 3.87 - 5.11 MIL/uL   Hemoglobin 12.0 12.0 - 15.0 g/dL   HCT 36.8 36.0 - 46.0 %   MCV 87.6 78.0 - 100.0 fL   MCH 28.6 26.0 - 34.0 pg   MCHC 32.6 30.0 - 36.0 g/dL   RDW 15.4 11.5 - 15.5 %   Platelets 191 150 - 400 K/uL  Lactate dehydrogenase     Status: Abnormal   Collection Time: 05/21/15  5:46 PM  Result Value Ref Range   LDH 237 (H) 98 - 192 U/L  Uric acid     Status: Abnormal   Collection Time: 05/21/15  5:46 PM  Result Value Ref Range   Uric Acid, Serum 8.1 (H) 2.3 - 6.6 mg/dL  Type and screen     Status: None   Collection Time: 05/21/15  5:46 PM   Result Value Ref Range   ABO/RH(D) O POS    Antibody Screen NEG    Sample Expiration 05/24/2015   CBC     Status: Abnormal   Collection Time: 05/21/15 11:46 PM  Result Value Ref Range   WBC 17.6 (H) 4.0 - 10.5 K/uL   RBC 4.23 3.87 - 5.11 MIL/uL   Hemoglobin 12.1 12.0 - 15.0 g/dL   HCT 36.8 36.0 - 46.0 %   MCV 87.0 78.0 - 100.0 fL   MCH 28.6 26.0 - 34.0 pg   MCHC 32.9 30.0 - 36.0 g/dL   RDW 15.6 (H) 11.5 - 15.5 %   Platelets 176 150 - 400 K/uL  Comprehensive metabolic panel     Status: Abnormal   Collection Time: 05/21/15 11:46 PM  Result Value Ref Range   Sodium 133 (L) 135 - 145 mmol/L   Potassium 3.9 3.5 - 5.1 mmol/L   Chloride 110 101 - 111 mmol/L   CO2 18 (L) 22 - 32 mmol/L   Glucose, Bld 97 65 - 99 mg/dL   BUN 13 6 - 20 mg/dL   Creatinine, Ser 1.32 (H) 0.44 - 1.00 mg/dL   Calcium 8.7 (L) 8.9 - 10.3 mg/dL   Total Protein 6.5 6.5 - 8.1 g/dL   Albumin 2.8 (L) 3.5 - 5.0 g/dL   AST 36 15 - 41 U/L   ALT 23 14 - 54 U/L   Alkaline Phosphatase 244 (H) 38 - 126 U/L   Total Bilirubin 0.6 0.3 - 1.2 mg/dL   GFR calc non Af Amer 50 (L) >60 mL/min   GFR calc Af Amer 58 (L) >60 mL/min   Anion gap 5 5 - 15  Lactate dehydrogenase     Status: None   Collection Time: 05/21/15 11:46 PM  Result Value Ref Range   LDH 188 98 - 192 U/L  Uric acid     Status: Abnormal   Collection Time: 05/21/15 11:46 PM  Result Value Ref Range   Uric Acid,  Serum 7.9 (H) 2.3 - 6.6 mg/dL   Assessment:  IUP at 39.4 wks IOL due to preeclampsia with severe features Latent labor GBS positive Likely SROM since 0700 on 05/22/15 -- no evidence of infection Cat 2 FHRT  Plan: Amnioinfusion Continue maternal resuscitative measures Consult prn  Farrel Gordon CNM 05/22/2015, 4:11 AM

## 2015-05-22 NOTE — Progress Notes (Signed)
Labor Progress  Subjective: No complaints, managing labor with breathing and family support  Objective: BP 150/86 mmHg  Pulse 94  Temp(Src) 97.4 F (36.3 C) (Oral)  Resp 20  Ht 5\' 5"  (1.651 m)  Wt 174 lb (78.926 kg)  BMI 28.96 kg/m2  LMP 08/18/2014 I/O last 3 completed shifts: In: 2754.2 [P.O.:660; I.V.:1644.2; IV Piggyback:450] Out: 62 [Urine:770] Total I/O In: 1071.7 [P.O.:240; I.V.:731.7; IV Piggyback:100] Out: 450 [Urine:450] FHT: 125. Min-mod variability, occasional accl no decels CTX:  irregular, every 2-5 minutes Uterus gravid, soft non tender SVE:  deferred Pitocin at 60mUn/min  Assessment:  IUP at 39.4 weeks NICHD: Category 2 Membranes:  SROM x 31hrs, no s/s of infection Labor progress: IOL MVUs 200 Pitocin Augmentation GBS: positive Pre eclampsia Mag 1gm/hr Urine out put 250 since 1100 Amnioinfusion 150 cc/hr.  Plan: Continue labor plan Continuous monitoring Rest Frequent position changes to facilitate fetal rotation and descent. Will reassess with cervical exam at 1700 or earlier if necessary Continue pitocin per protocol      Zeph Riebel, CNM, MSN 05/22/2015. 2:01 PM

## 2015-05-22 NOTE — Progress Notes (Addendum)
[name] MRN: [id number]  Subjective: -Patient resting in bed.  Reports comfort with epidural.  Continues to deny headache, visual disturbances, epigastric pain, and edema (numbness/tingling).    Objective: BP 133/73 mmHg  Pulse 92  Temp(Src) 97.8 F (36.6 C) (Oral)  Resp 20  Ht 5\' 5"  (1.651 m)  Wt 78.926 kg (174 lb)  BMI 28.96 kg/m2  SpO2 100%  LMP 08/18/2014 I/O last 3 completed shifts: In: 4954.6 [P.O.:1110; I.V.:3094.6; IV Piggyback:750] Out: 4827 [Urine:1470]   FHT: 135 bpm, Mod Var, -Decels, +Accels UC: None graphed or palpated   SVE:   Dilation: 3.5 Effacement (%): 50 Station: -3 Exam by:: J.Damani Kelemen, CNM Membranes:SROM x 37hrs Pitocin:Initiated  Assessment:  IUP at 39.4wks Cat I FT  PreEclampsia Pitocin Augmentation Afebrile GBS Positive  Plan: -Restart pitocin at 72mUn/min -Discussed meconium findings and significance -Family questions and concerns addressed -Continue other mgmt as ordered   Mackenzie Kaylor LYNN,MSN, CNM 05/22/2015, 9:42 PM   Addendum S: Nurse call reports unable to start pitocin due to > 30 minute since cervical exam.  Provider in room to confirm vertex presentation O: Vertex confirmed, via [place], with infant spine noted on maternal left A: IUP at 39.4wks Vertex Presentation Pitocin Augmentation P: Start pitocin  Sedalia Muta MSN, CNM 05/22/2015 10:31 PM

## 2015-05-22 NOTE — Plan of Care (Signed)
Abnormal Lab and/or Testing Results:  Magnesium level 8.6  Treatment provided:  V Standard CNM notified of pt's status and lab results  Possible Clinical Conditions:  Watching closely for signs of magnesium toxicity; Pt has decreased urine output

## 2015-05-22 NOTE — Progress Notes (Signed)
Labor Progress  Subjective: No complaints  Objective: BP 146/94 mmHg  Pulse 89  Temp(Src) 97.8 F (36.6 C) (Oral)  Resp 20  Ht 5\' 5"  (1.651 m)  Wt 174 lb (78.926 kg)  BMI 28.96 kg/m2  LMP 08/18/2014 I/O last 3 completed shifts: In: 2754.2 [P.O.:660; I.V.:1644.2; IV Piggyback:450] Out: 80 [Urine:770]   FHT: 125, min-mod variability, + accel, no decel CTX:  regular, every 3-4 minutes Uterus gravid, soft non tender SVE:  Dilation: 3 Effacement (%): 50 Station: -3 Exam by:: V Cervando Durnin CNM Pitocin at 59mUn/min  Assessment:  IUP at 39.4 weeks NICHD: Category 1 Membranes:  SROM x 28.5hrs, no s/s of infection Labor progress: IOL MVUs 150 Pitocin Augmentation GBS: positive Mag 1 gm/hr Urine output 200cc since 0600   Plan: Continue labor plan Continuous monitoring Frequent position changes to facilitate fetal rotation and descent. Will reassess with cervical exam at 1500or earlier if necessary Continue pitocin per protocol Amnioinfusion 150 cc/hr. Dr Raphael Gibney consulted     [name], CNM, MSN 05/22/2015. 11:18 AM

## 2015-05-22 NOTE — Progress Notes (Signed)
MAU Addendum Note  Results for orders placed or performed during the hospital encounter of 05/21/15 (from the past 24 hour(s))  CBC     Status: Abnormal   Collection Time: 05/21/15 11:46 PM  Result Value Ref Range   WBC 17.6 (H) 4.0 - 10.5 K/uL   RBC 4.23 3.87 - 5.11 MIL/uL   Hemoglobin 12.1 12.0 - 15.0 g/dL   HCT 36.8 36.0 - 46.0 %   MCV 87.0 78.0 - 100.0 fL   MCH 28.6 26.0 - 34.0 pg   MCHC 32.9 30.0 - 36.0 g/dL   RDW 15.6 (H) 11.5 - 15.5 %   Platelets 176 150 - 400 K/uL  Comprehensive metabolic panel     Status: Abnormal   Collection Time: 05/21/15 11:46 PM  Result Value Ref Range   Sodium 133 (L) 135 - 145 mmol/L   Potassium 3.9 3.5 - 5.1 mmol/L   Chloride 110 101 - 111 mmol/L   CO2 18 (L) 22 - 32 mmol/L   Glucose, Bld 97 65 - 99 mg/dL   BUN 13 6 - 20 mg/dL   Creatinine, Ser 1.32 (H) 0.44 - 1.00 mg/dL   Calcium 8.7 (L) 8.9 - 10.3 mg/dL   Total Protein 6.5 6.5 - 8.1 g/dL   Albumin 2.8 (L) 3.5 - 5.0 g/dL   AST 36 15 - 41 U/L   ALT 23 14 - 54 U/L   Alkaline Phosphatase 244 (H) 38 - 126 U/L   Total Bilirubin 0.6 0.3 - 1.2 mg/dL   GFR calc non Af Amer 50 (L) >60 mL/min   GFR calc Af Amer 58 (L) >60 mL/min   Anion gap 5 5 - 15  Lactate dehydrogenase     Status: None   Collection Time: 05/21/15 11:46 PM  Result Value Ref Range   LDH 188 98 - 192 U/L  Uric acid     Status: Abnormal   Collection Time: 05/21/15 11:46 PM  Result Value Ref Range   Uric Acid, Serum 7.9 (H) 2.3 - 6.6 mg/dL  Magnesium     Status: Abnormal   Collection Time: 05/22/15  8:20 AM  Result Value Ref Range   Magnesium 6.9 (HH) 1.7 - 2.4 mg/dL  CBC     Status: Abnormal   Collection Time: 05/22/15  8:20 AM  Result Value Ref Range   WBC 22.6 (H) 4.0 - 10.5 K/uL   RBC 4.28 3.87 - 5.11 MIL/uL   Hemoglobin 12.5 12.0 - 15.0 g/dL   HCT 37.4 36.0 - 46.0 %   MCV 87.4 78.0 - 100.0 fL   MCH 29.2 26.0 - 34.0 pg   MCHC 33.4 30.0 - 36.0 g/dL   RDW 15.6 (H) 11.5 - 15.5 %   Platelets 181 150 - 400 K/uL  CBC      Status: Abnormal   Collection Time: 05/22/15  5:30 PM  Result Value Ref Range   WBC 21.1 (H) 4.0 - 10.5 K/uL   RBC 4.27 3.87 - 5.11 MIL/uL   Hemoglobin 12.2 12.0 - 15.0 g/dL   HCT 37.4 36.0 - 46.0 %   MCV 87.6 78.0 - 100.0 fL   MCH 28.6 26.0 - 34.0 pg   MCHC 32.6 30.0 - 36.0 g/dL   RDW 15.7 (H) 11.5 - 15.5 %   Platelets 191 150 - 400 K/uL  Magnesium     Status: Abnormal   Collection Time: 05/22/15  5:30 PM  Result Value Ref Range   Magnesium 8.6 (HH) 1.7 - 2.4  mg/dL     Plan: Dr Raphael Gibney consulted Turn off mag for 4 hours then restart at 0.5   [facility] Lonie Newsham, CNM, MSN 05/22/2015. 6:46 PM

## 2015-05-22 NOTE — Progress Notes (Signed)
Labor Progress  Subjective: Ctx starting to get stronger, SP IV pain med.  Discussed IVP vs epidural discussed including the benefits the epidural will have on her BP.  Pt agreed.  Objective: BP 142/95 mmHg  Pulse 89  Temp(Src) 97.9 F (36.6 C) (Oral)  Resp 20  Ht 5\' 5"  (1.651 m)  Wt 174 lb (78.926 kg)  BMI 28.96 kg/m2  LMP 08/18/2014 I/O last 3 completed shifts: In: 2754.2 [P.O.:660; I.V.:1644.2; IV Piggyback:450] Out: 770 [Urine:770] Total I/O In: 1897.7 [P.O.:450; I.V.:1247.7; IV Piggyback:200] Out: 89 [Urine:450] FHT: 125, min-mod variability, + accel, no decel CTX:  irregular, every 4-8 minutes Uterus gravid, soft non tender SVE:  Dilation: 3 Effacement (%): 50 Station: -3 Exam by:: V Kleo Dungee CNM Pitocin at 49mUn/min  Assessment:  IUP at 39.4 weeks NICHD: Category 2 Category 3 with active intrauterine resuscitative measures  Membranes:  SROM x 34hrs, no s/s of infection Labor progress: IOL, Inadquate labor MVUs 180 Pitocin Augmentation GBS: positive     Plan: Continue labor plan Continuous monitoring Rest Frequent position changes to facilitate fetal rotation and descent. Turn off pitocin for a few hours Continue pitocin per protocol Epidural Mag level Full PIH panel and repeat mag level in the am Dr Raphael Gibney consulted     [name], CNM, MSN 05/22/2015. 5:31 PM

## 2015-05-22 NOTE — Anesthesia Procedure Notes (Signed)
Epidural Patient location during procedure: OB  Staffing Anesthesiologist: Davyn Elsasser Performed by: anesthesiologist   Preanesthetic Checklist Completed: patient identified, site marked, surgical consent, pre-op evaluation, timeout performed, IV checked, risks and benefits discussed and monitors and equipment checked  Epidural Patient position: sitting Prep: site prepped and draped and DuraPrep Patient monitoring: continuous pulse ox and blood pressure Approach: midline Location: L3-L4 Injection technique: LOR saline  Needle:  Needle type: Tuohy  Needle gauge: 17 G Needle length: 9 cm and 9 Needle insertion depth: 5 cm cm Catheter type: closed end flexible Catheter size: 19 Gauge Catheter at skin depth: 10 cm Test dose: negative  Assessment Events: blood not aspirated, injection not painful, no injection resistance, negative IV test and no paresthesia  Additional Notes Patient identified. Risks/Benefits/Options discussed with patient including but not limited to bleeding, infection, nerve damage, paralysis, failed block, incomplete pain control, headache, blood pressure changes, nausea, vomiting, reactions to medication both or allergic, itching and postpartum back pain. Confirmed with bedside nurse the patient's most recent platelet count. Confirmed with patient that they are not currently taking any anticoagulation, have any bleeding history or any family history of bleeding disorders. Patient expressed understanding and wished to proceed. All questions were answered. Sterile technique was used throughout the entire procedure. Please see nursing notes for vital signs. Test dose was given through epidural catheter and negative prior to continuing to dose epidural or start infusion. Warning signs of high block given to the patient including shortness of breath, tingling/numbness in hands, complete motor block, or any concerning symptoms with instructions to call for help. Patient was  given instructions on fall risk and not to get out of bed. All questions and concerns addressed with instructions to call with any issues or inadequate analgesia.      

## 2015-05-22 NOTE — Progress Notes (Signed)
  Subjective: Sleeping, yet easily aroused. Denies severe h/a, visual changes, epigastric pain or difficulty breathing, nausea, flushing, warmth, somnolence, slurred speech, weakness, CP or SOB.   Objective: BP 134/85 mmHg  Pulse 88  Temp(Src) 97.4 F (36.3 C) (Oral)  Resp 18  Ht 5\' 5"  (1.651 m)  Wt 78.926 kg (174 lb)  BMI 28.96 kg/m2  LMP 08/18/2014    Today's Vitals   05/22/15 0605 05/22/15 0612 05/22/15 0630 05/22/15 0643  BP:   134/85   Pulse:   88   Temp:  97.4 F (36.3 C)    TempSrc:  Oral    Resp:   18   Height:      Weight:      PainSc: 9    6     Total I/O In: 2628.7 [P.O.:660; I.V.:1518.7; IV Piggyback:450] Out: 770 [Urine:770] ~ 64 ml/hr  2+ DTRs bilaterally FHT: BL 130 w/ min variability, no accels -- likely due to Magnesium Sulfate; no decels UC:   irregular, every 2-3 minutes, MVUs 125 SVE: Deferred    Pitocin at 21 mU/min Preeclampsia labs at 23:46 PM essentially unchanged from that on admission  Assessment:  IOL due to preE Low normal uop GBS positive Reassuring FHRT  Plan: Continue induction Continue Magnesium Sulfate Monitor urine output closely Dr. Cletis Media updated  Jimmye Norman, [facility] CNM 05/22/2015, 6:50 AM

## 2015-05-22 NOTE — Progress Notes (Signed)
Tharon Kitch MRN: [id number]  Subjective: -Care Assumed of 39 y.o. G2P0010 at 39.4wks who presented in labor, but induced due to diagnosis of PreEclampsia with Severe Features. Currently MgSO4 and Pitocin discontinued. In room to inform patient of POC which was discussed and agreed upon with Dr. Carmon Sails.  Patient reports no pain, visual disturbances, epigastric pain, or edema. Patient questions when she should get epidural. No other questions or concerns.   Objective: BP 162/97 mmHg  Pulse 94  Temp(Src) 97.6 F (36.4 C) (Oral)  Resp 24  Ht 5\' 5"  (1.651 m)  Wt 78.926 kg (174 lb)  BMI 28.96 kg/m2  SpO2 98%  LMP 08/18/2014 I/O last 3 completed shifts: In: 4954.6 [P.O.:1110; I.V.:3094.6; IV Piggyback:750] Out: 0762 [Urine:1470]    Physical Exam: General appearance: alert, well appearing, and in no distress. Chest: clear to auscultation, no wheezes, rales or rhonchi, symmetric air entry.  CVS exam: normal rate and regular rhythm. Abdominal exam: Soft, NT, Appears AGA Musculoskeletal exam: no joint tenderness, deformity or swelling. 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   FHT: 125 bpm, Mod Var, -Decels, +Accels UC: None graphed or palpated SVE: Deferred   Membranes:SROM x 36hrs Pitocin:None  Assessment:  IUP at 39.4wks Cat I FT  PreEclampsia Prolonged SROM Afebrile GBS Positive  Plan: -Patient informed to get epidural now -Will perform SVE and restart pitocin at 2130 -Redraw MgSO4 level at 2230 and restart MgSO4 after results noted and report to Dr. Carmon Sails -Nurses informed of POC -No questions or concerns -Continue other mgmt as ordered  [facility], Tascha Casares LYNN,MSN, CNM 05/22/2015, 8:14 PM

## 2015-05-22 NOTE — Progress Notes (Signed)
Labor Progress  Subjective: C/o ctx pain, managing with breathing and family support  Objective: BP 146/92 mmHg  Pulse 88  Temp(Src) 97.8 F (36.6 C) (Oral)  Resp 20  Ht 5\' 5"  (1.651 m)  Wt 174 lb (78.926 kg)  BMI 28.96 kg/m2  LMP 08/18/2014 I/O last 3 completed shifts: In: 2754.2 [P.O.:660; I.V.:1644.2; IV Piggyback:450] Out: 65 [Urine:770]   FHT: 135, moderate variability, +accel, no decels CTX:  regular, every 3-4 minutes Uterus gravid, soft non tender SVE:  Dilation: 3 Effacement (%): 50 Station: -3 Exam by:: Jimmye Norman, CNM Pitocin at 50mUn/min  Assessment:  IUP at 39.4 weeks NICHD: Category 1 Membranes:  SROM x 25hrs, no s/s of infection Labor progress: Inadquate labor MVUs 150 Pitocin Augmentation GBS: positive Pre-clampsia  Mag 2 gm maintenance  Lungs clear bilaterally 1+ DTRs bilaterally Urine out put 200 at 0600 but a Amnioinfusion 150 cc/hr. Mag level this am 6.9, asymptomatic Flat affect   Plan: Continue IOL plan Continuous monitoring Frequent position changes to facilitate fetal rotation and descent. Will reassess with cervical exam at 1200 or earlier if necessary Continue pitocin per protocol Epidural per pt request       Laterrian Hevener, CNM, MSN   05/22/2015. 9:03 AM

## 2015-05-22 NOTE — Progress Notes (Signed)
Abnormal Lab and/or Testing Results: Magnesium level 6.9  Treatment provided: Notified CNM of results and patient status.  Possible Clinical Conditions:  Watch for S/S of toxicity, more frequent reflex checks.

## 2015-05-22 NOTE — Plan of Care (Signed)
Per Venus Standard CNM, Dr. Raphael Gibney gave order for Magnesium to be decreased to 1 gm/hr.  Order now entered and Magnesium decreased to 1 gm/hr.

## 2015-05-22 NOTE — Anesthesia Preprocedure Evaluation (Addendum)
Anesthesia Evaluation  Patient identified by MRN, date of birth, ID band Patient awake    Reviewed: Allergy & Precautions, NPO status , Patient's Chart, lab work & pertinent test results  History of Anesthesia Complications Negative for: history of anesthetic complications  Airway Mallampati: II  TM Distance: >3 FB Neck ROM: Full    Dental no notable dental hx. (+) Dental Advisory Given   Pulmonary former smoker,  breath sounds clear to auscultation  Pulmonary exam normal       Cardiovascular hypertension (PreE with severe features), Pt. on medications Normal cardiovascular examRhythm:Regular Rate:Normal     Neuro/Psych negative neurological ROS  negative psych ROS   GI/Hepatic negative GI ROS, Neg liver ROS,   Endo/Other  obesity  Renal/GU negative Renal ROS  negative genitourinary   Musculoskeletal negative musculoskeletal ROS (+)   Abdominal   Peds negative pediatric ROS (+)  Hematology negative hematology ROS (+)   Anesthesia Other Findings   Reproductive/Obstetrics (+) Pregnancy                            Anesthesia Physical Anesthesia Plan  ASA: III and emergent  Anesthesia Plan: Epidural   Post-op Pain Management:    Induction:   Airway Management Planned:   Additional Equipment:   Intra-op Plan:   Post-operative Plan:   Informed Consent: I have reviewed the patients History and Physical, chart, labs and discussed the procedure including the risks, benefits and alternatives for the proposed anesthesia with the patient or authorized representative who has indicated his/her understanding and acceptance.     Plan Discussed with: Anesthesiologist, CRNA and Surgeon  Anesthesia Plan Comments: (Patient for C/section for failure to progress. Will use epidural for C/Section.)       Anesthesia Quick Evaluation

## 2015-05-23 ENCOUNTER — Encounter (HOSPITAL_COMMUNITY): Payer: Self-pay | Admitting: Anesthesiology

## 2015-05-23 ENCOUNTER — Encounter (HOSPITAL_COMMUNITY)
Admission: AD | Disposition: A | Discharge status: Home / Self Care | Payer: Self-pay | Source: Ambulatory Visit | Attending: Obstetrics and Gynecology

## 2015-05-23 DIAGNOSIS — O1410 Severe pre-eclampsia, unspecified trimester: Secondary | ICD-10-CM | POA: Diagnosis present

## 2015-05-23 LAB — COMPREHENSIVE METABOLIC PANEL
ALBUMIN: 2.6 g/dL — AB (ref 3.5–5.0)
ALBUMIN: 2.3 g/dL — AB (ref 3.5–5.0)
ALK PHOS: 232 U/L — AB (ref 38–126)
ALT: 19 U/L (ref 14–54)
ALT: 15 U/L (ref 14–54)
ANION GAP: 11 (ref 5–15)
AST: 31 U/L (ref 15–41)
AST: 27 U/L (ref 15–41)
Alkaline Phosphatase: 201 U/L — ABNORMAL HIGH (ref 38–126)
Anion gap: 6 (ref 5–15)
BILIRUBIN TOTAL: 0.5 mg/dL (ref 0.3–1.2)
BUN: 15 mg/dL (ref 6–20)
BUN: 17 mg/dL (ref 6–20)
CALCIUM: 7.8 mg/dL — AB (ref 8.9–10.3)
CHLORIDE: 106 mmol/L (ref 101–111)
CO2: 15 mmol/L — ABNORMAL LOW (ref 22–32)
CO2: 18 mmol/L — ABNORMAL LOW (ref 22–32)
CREATININE: 1.76 mg/dL — AB (ref 0.44–1.00)
Calcium: 7.6 mg/dL — ABNORMAL LOW (ref 8.9–10.3)
Chloride: 103 mmol/L (ref 101–111)
Creatinine, Ser: 1.63 mg/dL — ABNORMAL HIGH (ref 0.44–1.00)
GFR calc Af Amer: 45 mL/min — ABNORMAL LOW (ref >60)
GFR calc Af Amer: 41 mL/min — ABNORMAL LOW (ref >60)
GFR calc non Af Amer: 35 mL/min — ABNORMAL LOW (ref >60)
GFR, EST NON AFRICAN AMERICAN: 39 mL/min — AB (ref >60)
GLUCOSE: 133 mg/dL — AB (ref 65–99)
Glucose, Bld: 141 mg/dL — ABNORMAL HIGH (ref 65–99)
POTASSIUM: 3.9 mmol/L (ref 3.5–5.1)
Potassium: 4.1 mmol/L (ref 3.5–5.1)
SODIUM: 130 mmol/L — AB (ref 135–145)
Sodium: 129 mmol/L — ABNORMAL LOW (ref 135–145)
TOTAL PROTEIN: 6.4 g/dL — AB (ref 6.5–8.1)
TOTAL PROTEIN: 5.7 g/dL — AB (ref 6.5–8.1)
Total Bilirubin: 0.7 mg/dL (ref 0.3–1.2)

## 2015-05-23 LAB — CBC
HCT: 31.5 % — ABNORMAL LOW (ref 36.0–46.0)
HEMOGLOBIN: 10.3 g/dL — AB (ref 12.0–15.0)
MCH: 28.8 pg (ref 26.0–34.0)
MCHC: 32.7 g/dL (ref 30.0–36.0)
MCV: 88 fL (ref 78.0–100.0)
Platelets: 159 10*3/uL (ref 150–400)
RBC: 3.58 MIL/uL — ABNORMAL LOW (ref 3.87–5.11)
RDW: 15.8 % — ABNORMAL HIGH (ref 11.5–15.5)
WBC: 20.4 10*3/uL — ABNORMAL HIGH (ref 4.0–10.5)

## 2015-05-23 LAB — MAGNESIUM
MAGNESIUM: 7.2 mg/dL — AB (ref 1.7–2.4)
MAGNESIUM: 5.2 mg/dL — AB (ref 1.7–2.4)
Magnesium: 5.8 mg/dL — ABNORMAL HIGH (ref 1.7–2.4)
Magnesium: 5.6 mg/dL — ABNORMAL HIGH (ref 1.7–2.4)

## 2015-05-23 LAB — PROTEIN / CREATININE RATIO, URINE
CREATININE, URINE: 43 mg/dL
CREATININE, URINE: 44 mg/dL
PROTEIN CREATININE RATIO: 0.65 mg/mg{creat} — AB (ref 0.00–0.15)
PROTEIN CREATININE RATIO: 0.61 mg/mg{creat} — AB (ref 0.00–0.15)
TOTAL PROTEIN, URINE: 27 mg/dL
Total Protein, Urine: 28 mg/dL

## 2015-05-23 LAB — CBC WITH DIFFERENTIAL/PLATELET
BASOS ABS: 0 10*3/uL (ref 0.0–0.1)
BASOS PCT: 0 % (ref 0–1)
EOS PCT: 0 % (ref 0–5)
Eosinophils Absolute: 0 10*3/uL (ref 0.0–0.7)
HCT: 35.1 % — ABNORMAL LOW (ref 36.0–46.0)
HEMOGLOBIN: 11.5 g/dL — AB (ref 12.0–15.0)
LYMPHS PCT: 8 % — AB (ref 12–46)
Lymphs Abs: 1.4 10*3/uL (ref 0.7–4.0)
MCH: 28.9 pg (ref 26.0–34.0)
MCHC: 32.8 g/dL (ref 30.0–36.0)
MCV: 88.2 fL (ref 78.0–100.0)
MONO ABS: 0.7 10*3/uL (ref 0.1–1.0)
Monocytes Relative: 4 % (ref 3–12)
Neutro Abs: 16.4 10*3/uL — ABNORMAL HIGH (ref 1.7–7.7)
Neutrophils Relative %: 88 % — ABNORMAL HIGH (ref 43–77)
Platelets: 176 10*3/uL (ref 150–400)
RBC: 3.98 MIL/uL (ref 3.87–5.11)
RDW: 15.7 % — AB (ref 11.5–15.5)
WBC: 18.5 10*3/uL — ABNORMAL HIGH (ref 4.0–10.5)

## 2015-05-23 LAB — URIC ACID: URIC ACID, SERUM: 8.6 mg/dL — AB (ref 2.3–6.6)

## 2015-05-23 LAB — LACTATE DEHYDROGENASE: LDH: 159 U/L (ref 98–192)

## 2015-05-23 SURGERY — Surgical Case
Anesthesia: Epidural

## 2015-05-23 MED ORDER — WITCH HAZEL-GLYCERIN EX PADS: 1.0000 "application " | MEDICATED_PAD | CUTANEOUS | Status: DC | PRN

## 2015-05-23 MED ORDER — MEPERIDINE HCL 25 MG/ML IJ SOLN: 6.2500 mg | INTRAMUSCULAR | Status: DC | PRN

## 2015-05-23 MED ORDER — OXYTOCIN 40 UNITS IN LACTATED RINGERS INFUSION - SIMPLE MED
62.5000 mL/h | INTRAVENOUS | Status: AC
Start: 2015-05-23 — End: 2015-05-24

## 2015-05-23 MED ORDER — MISOPROSTOL 200 MCG PO TABS
ORAL_TABLET | ORAL | Status: AC
  Filled 2015-05-23: qty 1

## 2015-05-23 MED ORDER — ONDANSETRON HCL 4 MG/2ML IJ SOLN: 4.0000 mg | Freq: Three times a day (TID) | INTRAMUSCULAR | Status: DC | PRN

## 2015-05-23 MED ORDER — DIPHENHYDRAMINE HCL 50 MG/ML IJ SOLN: 12.5000 mg | INTRAMUSCULAR | Status: DC | PRN

## 2015-05-23 MED ORDER — LIDOCAINE-EPINEPHRINE (PF) 2 %-1:200000 IJ SOLN
INTRAMUSCULAR | Status: DC | PRN
  Administered 2015-05-23 (×3): 5 mL via EPIDURAL

## 2015-05-23 MED ORDER — OXYTOCIN 10 UNIT/ML IJ SOLN
INTRAMUSCULAR | Status: AC
  Filled 2015-05-23: qty 4

## 2015-05-23 MED ORDER — KETOROLAC TROMETHAMINE 30 MG/ML IJ SOLN: 30.0000 mg | Freq: Four times a day (QID) | INTRAMUSCULAR | Status: DC | PRN

## 2015-05-23 MED ORDER — BUPIVACAINE HCL (PF) 0.25 % IJ SOLN
INTRAMUSCULAR | Status: DC | PRN
  Administered 2015-05-23: 20 mL

## 2015-05-23 MED ORDER — SODIUM BICARBONATE 8.4 % IV SOLN
INTRAVENOUS | Status: AC
  Filled 2015-05-23: qty 50

## 2015-05-23 MED ORDER — NALBUPHINE HCL 10 MG/ML IJ SOLN: 5.0000 mg | Freq: Once | INTRAMUSCULAR | Status: AC | PRN

## 2015-05-23 MED ORDER — MENTHOL 3 MG MT LOZG: 1.0000 | LOZENGE | OROMUCOSAL | Status: DC | PRN

## 2015-05-23 MED ORDER — MORPHINE SULFATE 0.5 MG/ML IJ SOLN
INTRAMUSCULAR | Status: AC
  Filled 2015-05-23: qty 10

## 2015-05-23 MED ORDER — MISOPROSTOL 200 MCG PO TABS
ORAL_TABLET | ORAL | Status: AC
  Filled 2015-05-23: qty 4

## 2015-05-23 MED ORDER — TRIAMCINOLONE ACETONIDE 40 MG/ML IJ SUSP
INTRAMUSCULAR | Status: DC | PRN
  Administered 2015-05-23: 10 mL via INTRAMUSCULAR

## 2015-05-23 MED ORDER — OXYCODONE-ACETAMINOPHEN 5-325 MG PO TABS: 1.0000 | ORAL_TABLET | ORAL | Status: DC | PRN

## 2015-05-23 MED ORDER — SIMETHICONE 80 MG PO CHEW
80.0000 mg | CHEWABLE_TABLET | ORAL | Status: DC
  Administered 2015-05-24 – 2015-05-25 (×3): 80 mg via ORAL
  Filled 2015-05-23 (×3): qty 1

## 2015-05-23 MED ORDER — LANOLIN HYDROUS EX OINT
1.0000 | TOPICAL_OINTMENT | CUTANEOUS | Status: DC | PRN
Start: 2015-05-23 — End: 2015-05-26

## 2015-05-23 MED ORDER — FERROUS SULFATE 325 (65 FE) MG PO TABS
325.0000 mg | ORAL_TABLET | Freq: Two times a day (BID) | ORAL | Status: DC
  Administered 2015-05-24 – 2015-05-26 (×5): 325 mg via ORAL
  Filled 2015-05-23 (×5): qty 1

## 2015-05-23 MED ORDER — BUPIVACAINE HCL (PF) 0.25 % IJ SOLN
INTRAMUSCULAR | Status: AC
  Filled 2015-05-23: qty 10

## 2015-05-23 MED ORDER — NALBUPHINE HCL 10 MG/ML IJ SOLN
5.0000 mg | INTRAMUSCULAR | Status: DC | PRN
Start: 2015-05-23 — End: 2015-05-26

## 2015-05-23 MED ORDER — METHYLERGONOVINE MALEATE 0.2 MG PO TABS: 0.2000 mg | ORAL_TABLET | ORAL | Status: DC | PRN

## 2015-05-23 MED ORDER — DIBUCAINE 1 % RE OINT: 1.0000 "application " | TOPICAL_OINTMENT | RECTAL | Status: DC | PRN

## 2015-05-23 MED ORDER — CEFAZOLIN SODIUM-DEXTROSE 2-3 GM-% IV SOLR
2.0000 g | INTRAVENOUS | Status: AC
  Administered 2015-05-23: 2 g via INTRAVENOUS
  Filled 2015-05-23: qty 50

## 2015-05-23 MED ORDER — TETANUS-DIPHTH-ACELL PERTUSSIS 5-2.5-18.5 LF-MCG/0.5 IM SUSP
0.5000 mL | Freq: Once | INTRAMUSCULAR | Status: DC
  Filled 2015-05-23: qty 0.5

## 2015-05-23 MED ORDER — IBUPROFEN 600 MG PO TABS: 600.0000 mg | ORAL_TABLET | Freq: Four times a day (QID) | ORAL | Status: DC | PRN

## 2015-05-23 MED ORDER — KETOROLAC TROMETHAMINE 30 MG/ML IJ SOLN
30.0000 mg | Freq: Four times a day (QID) | INTRAMUSCULAR | Status: DC | PRN
  Administered 2015-05-23: 30 mg via INTRAMUSCULAR

## 2015-05-23 MED ORDER — IBUPROFEN 600 MG PO TABS: 600.0000 mg | ORAL_TABLET | Freq: Four times a day (QID) | ORAL | Status: DC

## 2015-05-23 MED ORDER — DIPHENHYDRAMINE HCL 25 MG PO CAPS: 25.0000 mg | ORAL_CAPSULE | ORAL | Status: DC | PRN

## 2015-05-23 MED ORDER — ACETAMINOPHEN 325 MG PO TABS: 650.0000 mg | ORAL_TABLET | ORAL | Status: DC | PRN

## 2015-05-23 MED ORDER — FENTANYL CITRATE (PF) 100 MCG/2ML IJ SOLN: 25.0000 ug | INTRAMUSCULAR | Status: DC | PRN

## 2015-05-23 MED ORDER — NALBUPHINE HCL 10 MG/ML IJ SOLN: 5.0000 mg | INTRAMUSCULAR | Status: DC | PRN

## 2015-05-23 MED ORDER — MORPHINE SULFATE (PF) 0.5 MG/ML IJ SOLN
INTRAMUSCULAR | Status: DC | PRN
  Administered 2015-05-23: 4 mg via EPIDURAL

## 2015-05-23 MED ORDER — OXYCODONE-ACETAMINOPHEN 5-325 MG PO TABS: 2.0000 | ORAL_TABLET | ORAL | Status: DC | PRN

## 2015-05-23 MED ORDER — LIDOCAINE-EPINEPHRINE (PF) 2 %-1:200000 IJ SOLN
INTRAMUSCULAR | Status: AC
  Filled 2015-05-23: qty 20

## 2015-05-23 MED ORDER — ONDANSETRON HCL 4 MG/2ML IJ SOLN
INTRAMUSCULAR | Status: AC
  Filled 2015-05-23: qty 2

## 2015-05-23 MED ORDER — SCOPOLAMINE 1 MG/3DAYS TD PT72
1.0000 | MEDICATED_PATCH | Freq: Once | TRANSDERMAL | Status: DC
  Filled 2015-05-23: qty 1

## 2015-05-23 MED ORDER — DIPHENHYDRAMINE HCL 25 MG PO CAPS: 25.0000 mg | ORAL_CAPSULE | Freq: Four times a day (QID) | ORAL | Status: DC | PRN

## 2015-05-23 MED ORDER — NALOXONE HCL 1 MG/ML IJ SOLN
1.0000 ug/kg/h | INTRAVENOUS | Status: DC | PRN
  Filled 2015-05-23: qty 2

## 2015-05-23 MED ORDER — SIMETHICONE 80 MG PO CHEW
80.0000 mg | CHEWABLE_TABLET | Freq: Three times a day (TID) | ORAL | Status: DC
  Administered 2015-05-24 – 2015-05-26 (×7): 80 mg via ORAL
  Filled 2015-05-23 (×6): qty 1

## 2015-05-23 MED ORDER — NALOXONE HCL 0.4 MG/ML IJ SOLN: 0.4000 mg | INTRAMUSCULAR | Status: DC | PRN

## 2015-05-23 MED ORDER — PRENATAL MULTIVITAMIN CH
1.0000 | ORAL_TABLET | Freq: Every day | ORAL | Status: DC
Start: 2015-05-24 — End: 2015-05-26
  Administered 2015-05-24 – 2015-05-25 (×2): 1 via ORAL
  Filled 2015-05-23 (×2): qty 1

## 2015-05-23 MED ORDER — SIMETHICONE 80 MG PO CHEW: 80.0000 mg | CHEWABLE_TABLET | ORAL | Status: DC | PRN

## 2015-05-23 MED ORDER — MISOPROSTOL 100 MCG PO TABS
ORAL_TABLET | ORAL | Status: DC | PRN
  Administered 2015-05-23: 1000 ug

## 2015-05-23 MED ORDER — IBUPROFEN 600 MG PO TABS
600.0000 mg | ORAL_TABLET | Freq: Four times a day (QID) | ORAL | Status: DC
Start: 2015-05-23 — End: 2015-05-26
  Administered 2015-05-24 – 2015-05-26 (×9): 600 mg via ORAL
  Filled 2015-05-23 (×9): qty 1

## 2015-05-23 MED ORDER — SODIUM CHLORIDE 0.9 % IJ SOLN: 3.0000 mL | INTRAMUSCULAR | Status: DC | PRN

## 2015-05-23 MED ORDER — OXYTOCIN 10 UNIT/ML IJ SOLN
40.0000 [IU] | INTRAVENOUS | Status: DC | PRN
  Administered 2015-05-23: 40 [IU] via INTRAVENOUS

## 2015-05-23 MED ORDER — ZOLPIDEM TARTRATE 5 MG PO TABS: 5.0000 mg | ORAL_TABLET | Freq: Every evening | ORAL | Status: DC | PRN

## 2015-05-23 MED ORDER — LACTATED RINGERS IV SOLN
0.5000 g/h | INTRAVENOUS | Status: AC
  Administered 2015-05-23: 0.5 g/h via INTRAVENOUS
  Filled 2015-05-23: qty 80

## 2015-05-23 MED ORDER — LACTATED RINGERS IV SOLN
INTRAVENOUS | Status: AC
  Administered 2015-05-23 – 2015-05-24 (×2): via INTRAVENOUS

## 2015-05-23 MED ORDER — SENNOSIDES-DOCUSATE SODIUM 8.6-50 MG PO TABS
2.0000 | ORAL_TABLET | ORAL | Status: DC
  Administered 2015-05-24 – 2015-05-25 (×3): 2 via ORAL
  Filled 2015-05-23 (×3): qty 2

## 2015-05-23 MED ORDER — MEASLES, MUMPS & RUBELLA VAC ~~LOC~~ INJ
0.5000 mL | INJECTION | Freq: Once | SUBCUTANEOUS | Status: DC
  Filled 2015-05-23: qty 0.5

## 2015-05-23 MED ORDER — METHYLERGONOVINE MALEATE 0.2 MG/ML IJ SOLN: 0.2000 mg | INTRAMUSCULAR | Status: DC | PRN

## 2015-05-23 MED ORDER — TRIAMCINOLONE ACETONIDE 40 MG/ML IJ SUSP
Freq: Once | INTRAMUSCULAR | Status: DC
  Filled 2015-05-23: qty 1

## 2015-05-23 MED ORDER — KETOROLAC TROMETHAMINE 30 MG/ML IJ SOLN
INTRAMUSCULAR | Status: AC
  Administered 2015-05-23: 30 mg via INTRAMUSCULAR
  Filled 2015-05-23: qty 1

## 2015-05-23 SURGICAL SUPPLY — 38 items
BENZOIN TINCTURE PRP APPL 2/3 (GAUZE/BANDAGES/DRESSINGS) ×3 IMPLANT
BOOTIES KNEE HIGH SLOAN (MISCELLANEOUS) ×6 IMPLANT
CLAMP CORD UMBIL (MISCELLANEOUS) IMPLANT
CLOSURE WOUND 1/2 X4 (GAUZE/BANDAGES/DRESSINGS) ×1
CLOSURE WOUND 1/4X4 (GAUZE/BANDAGES/DRESSINGS) ×1
CLOTH BEACON ORANGE TIMEOUT ST (SAFETY) ×3 IMPLANT
DRAIN JACKSON PRT FLT 10 (DRAIN) IMPLANT
DRAPE SHEET LG 3/4 BI-LAMINATE (DRAPES) IMPLANT
DRSG OPSITE POSTOP 4X10 (GAUZE/BANDAGES/DRESSINGS) ×3 IMPLANT
DURAPREP 26ML APPLICATOR (WOUND CARE) ×3 IMPLANT
ELECT REM PT RETURN 9FT ADLT (ELECTROSURGICAL) ×3
ELECTRODE REM PT RTRN 9FT ADLT (ELECTROSURGICAL) ×1 IMPLANT
EVACUATOR SILICONE 100CC (DRAIN) IMPLANT
EXTRACTOR VACUUM M CUP 4 TUBE (SUCTIONS) IMPLANT
EXTRACTOR VACUUM M CUP 4' TUBE (SUCTIONS)
GLOVE BIOGEL PI IND STRL 7.0 (GLOVE) ×1 IMPLANT
GLOVE BIOGEL PI INDICATOR 7.0 (GLOVE) ×2
GLOVE ECLIPSE 6.5 STRL STRAW (GLOVE) ×3 IMPLANT
GOWN STRL REUS W/TWL LRG LVL3 (GOWN DISPOSABLE) ×6 IMPLANT
KIT ABG SYR 3ML LUER SLIP (SYRINGE) IMPLANT
NEEDLE HYPO 22GX1.5 SAFETY (NEEDLE) ×3 IMPLANT
NEEDLE HYPO 25X5/8 SAFETYGLIDE (NEEDLE) IMPLANT
NS IRRIG 1000ML POUR BTL (IV SOLUTION) ×6 IMPLANT
PACK C SECTION WH (CUSTOM PROCEDURE TRAY) ×3 IMPLANT
PAD OB MATERNITY 4.3X12.25 (PERSONAL CARE ITEMS) ×3 IMPLANT
RTRCTR C-SECT PINK 25CM LRG (MISCELLANEOUS) ×3 IMPLANT
STRIP CLOSURE SKIN 1/2X4 (GAUZE/BANDAGES/DRESSINGS) ×2 IMPLANT
STRIP CLOSURE SKIN 1/4X4 (GAUZE/BANDAGES/DRESSINGS) ×2 IMPLANT
SUT MNCRL AB 3-0 PS2 27 (SUTURE) ×3 IMPLANT
SUT SILK 2 0 FSL 18 (SUTURE) IMPLANT
SUT VIC AB 0 CTX 36 (SUTURE) ×4
SUT VIC AB 0 CTX36XBRD ANBCTRL (SUTURE) ×2 IMPLANT
SUT VIC AB 1 CT1 36 (SUTURE) ×6 IMPLANT
SUT VIC AB 2-0 CT1 27 (SUTURE)
SUT VIC AB 2-0 CT1 TAPERPNT 27 (SUTURE) IMPLANT
SYR 20CC LL (SYRINGE) ×3 IMPLANT
TOWEL OR 17X24 6PK STRL BLUE (TOWEL DISPOSABLE) ×3 IMPLANT
TRAY FOLEY CATH SILVER 14FR (SET/KITS/TRAYS/PACK) ×3 IMPLANT

## 2015-05-23 NOTE — Op Note (Signed)
Preoperative diagnosis: Intrauterine pregnancy at 39 weeks and 5 days, pre-eclampsia with severe features, failure to progress  Post operative diagnosis: Same  Anesthesia: Epidural  Anesthesiologist: Dr. Jillyn Hidden  Procedure: Primary low transverse cesarean section  Surgeon: Dr. Katharine Look Barney Gertsch  Assistant: Venus Standard CNM  Estimated blood loss: 800 cc  Procedure:  After being informed of the planned procedure and possible complications including bleeding, infection, injury to other organs, informed consent is obtained. The patient is taken to OR #1 and pre-existing epidural anesthesia was optimized  without complication. She is placed in the dorsal decubitus position with the pelvis tilted to the left. She is then prepped and draped in a sterile fashion. A Foley catheter is  in her bladder.  After assessing adequate level of anesthesia, we infiltrate the suprapubic area with 20 cc of Marcaine 0.25 and perform a Pfannenstiel incision which is brought down sharply to the fascia. The fascia is entered in a low transverse fashion. Linea alba is dissected. Peritoneum is entered in a midline fashion. An Alexis retractor is easily positioned. We encounter only a few adhesions between the omentum and abdominal wall.  The myometrium is then entered in a low transverse fashion, 2 cm above the vesico-uterine junction ; first with knife and then extended bluntly. Amniotic fluid is meconium stained. We assist the birth of a female  infant in vertex presentation. Mouth and nose are suctioned. The baby is delivered. The cord is clamped and sectioned. The baby is given to the neonatologist present in the room.  The placenta is allowed to deliver spontaneously. It is complete and the cord has 3 vessels. Uterine revision is negative.  We proceed with closure of the myometrium in 2 layers: First with a running locked suture of 0 Vicryl, then with a Lembert suture of 0 Vicryl imbricating the first one. Hemostasis is  completed with cauterization on peritoneal edges.  Both paracolic gutters are cleaned. Both tubes and ovaries are assessed and normal. The pelvis is profusely irrigated with warm saline to confirm a satisfactory hemostasis.  Retractors and sponges are removed. Under fascia hemostasis is completed with cauterization. The fascia is then closed with 2 running sutures of 0 Vicryl meeting midline. The wound is irrigated with warm saline and hemostasis is completed with cauterization. The skin is closed with a subcuticular suture of 3-0 Monocryl and Steri-Strips. We infiltrate the dermis with Kenalog 40 mg for reduction of cheloid scarring.  Instrument and sponge count is complete x2. Estimated blood loss is 800 cc.  The procedure is well tolerated by the patient who is taken to recovery room in a well and stable condition.  female baby named Harrell Gave was born at 14:16 and received an Apgar of 8  at 1 minute and 8 at 5 minutes.    Specimen: Placenta sent to pathology   [facility] A MD 7/13/20162:56 PM

## 2015-05-23 NOTE — Progress Notes (Addendum)
Labor Progress  Subjective: No complaints.  IOL process reviewed, reassurance given.  Affect greatly improved.  FOB and pt's mother at the bedside for support  Objective: BP 151/89 mmHg  Pulse 103  Temp(Src) 97.8 F (36.6 C) (Oral)  Resp 20  Ht 5\' 5"  (1.651 m)  Wt 174 lb (78.926 kg)  BMI 28.96 kg/m2  SpO2 100%  LMP 08/18/2014 I/O last 3 completed shifts: In: 5054.6 [P.O.:1110; I.V.:3094.6; IV Piggyback:850] Out: 2520 [Urine:2520]   FHT: 120, min variability, + accel, no decel CTX:  regular, every 2-3 minutes Uterus gravid, soft non tender SVE:  Dilation: 3.5 Effacement (%): 50 Station: -3 Exam by:: Milinda Cave, CNM  SVE deferred until 1100-1200 Pitocin at 15mUn/min  Assessment:  IUP at 39.5 weeks Category  2 Membranes:  SROM x 49hrs, no s/s of infection Labor progress: Inadquate IOL progress MVUs 180 Pitocin Augmentation GBS: positive Pre eclampsia Mag 1gm/hr  Plan: Continue labor plan Continuous monitoring Frequent position changes to facilitate fetal rotation and descent. Will reassess with cervical exam at 1200 or earlier if necessary Continue pitocin per protocol q 30 minutes      Starr Urias, CNM, MSN 05/23/2015. 8:19 AM

## 2015-05-23 NOTE — Progress Notes (Signed)
Pt was transferred in [place] to [place] 9 per secretary in error; Pt transferred to OR 1, no fhr tracing; looked in [place] and pt showed up in peri-op but no tracing; pt had been pend d/c in error per Network engineer.  Documented fhr manually.

## 2015-05-23 NOTE — Anesthesia Postprocedure Evaluation (Signed)
Anesthesia Post Note  Patient: [name]  Procedure(s) Performed: Procedure(s) (LRB): CESAREAN SECTION (N/A)  Anesthesia type: Epidural  Patient location: PACU  Post pain: Pain level controlled  Post assessment: Post-op Vital signs reviewed  Last Vitals:  Filed Vitals:   05/23/15 1545  BP: 146/81  Pulse: 113  Temp:   Resp: 20    Post vital signs: Reviewed  Level of consciousness: awake  Complications: No apparent anesthesia complications

## 2015-05-23 NOTE — Progress Notes (Signed)
Labor Progress  Subjective: No complaints.  POC reviewed including adequate ctx w/inadequate cervical dilation.  Continuing with IOL vs CS discussed.  Pt is tired and is in agreement with a CS.  Pt just had a clear diet and understand that we need to wait a few hours.  Objective: BP 144/84 mmHg  Pulse 102  Temp(Src) 97.7 F (36.5 C) (Oral)  Resp 18  Ht 5\' 5"  (1.651 m)  Wt 174 lb (78.926 kg)  BMI 28.96 kg/m2  SpO2 100%  LMP 08/18/2014 I/O last 3 completed shifts: In: 6617.7 [P.O.:1110; I.V.:4457.7; IV Piggyback:1050] Out: 2620 [Urine:2620] Total I/O In: 666.4 [I.V.:566.4; IV Piggyback:100] Out: 350 [Urine:350] FHT:135, moderate variability, + accel no decel CTX:  regular, every 2-3 minutes Uterus gravid, soft non tender SVE:  Unchanged 3cm Pitocin at 11mUn/min  Assessment:  IUP at 39.5 weeks NICHD: Category 1 Membranes:  SROM x 52hrs, no s/s of infection Labor progress: failed  IOL MVUs 240 Pitocin Augmentation GBS: positive Light mec   Plan: Continuous monitoring DC pitocin Prepare and prep pt for CS this afternoon Dr Cletis Media to be informed      [name], CNM, MSN 05/23/2015. 11:05 AM

## 2015-05-23 NOTE — Progress Notes (Addendum)
[name] [id number]  Subjective: Nurse call reports MgSO4 level of 7.2. Strip and Chart Reviewed.   Objective:  Filed Vitals:   05/22/15 2331 05/23/15 0001 05/23/15 0008 05/23/15 0030  BP: 162/90 162/109 154/92 153/92  Pulse: 99 107 100 101  Temp:      TempSrc:      Resp: 20 20 20 20   Height:      Weight:      SpO2:       Results for orders placed or performed during the hospital encounter of 05/21/15 (from the past 24 hour(s))  Magnesium     Status: Abnormal   Collection Time: 05/22/15  8:20 AM  Result Value Ref Range   Magnesium 6.9 (HH) 1.7 - 2.4 mg/dL  CBC     Status: Abnormal   Collection Time: 05/22/15  8:20 AM  Result Value Ref Range   WBC 22.6 (H) 4.0 - 10.5 K/uL   RBC 4.28 3.87 - 5.11 MIL/uL   Hemoglobin 12.5 12.0 - 15.0 g/dL   HCT 37.4 36.0 - 46.0 %   MCV 87.4 78.0 - 100.0 fL   MCH 29.2 26.0 - 34.0 pg   MCHC 33.4 30.0 - 36.0 g/dL   RDW 15.6 (H) 11.5 - 15.5 %   Platelets 181 150 - 400 K/uL  CBC     Status: Abnormal   Collection Time: 05/22/15  5:30 PM  Result Value Ref Range   WBC 21.1 (H) 4.0 - 10.5 K/uL   RBC 4.27 3.87 - 5.11 MIL/uL   Hemoglobin 12.2 12.0 - 15.0 g/dL   HCT 37.4 36.0 - 46.0 %   MCV 87.6 78.0 - 100.0 fL   MCH 28.6 26.0 - 34.0 pg   MCHC 32.6 30.0 - 36.0 g/dL   RDW 15.7 (H) 11.5 - 15.5 %   Platelets 191 150 - 400 K/uL  Magnesium     Status: Abnormal   Collection Time: 05/22/15  5:30 PM  Result Value Ref Range   Magnesium 8.6 (HH) 1.7 - 2.4 mg/dL  Magnesium     Status: Abnormal   Collection Time: 05/22/15 11:10 PM  Result Value Ref Range   Magnesium 7.2 (HH) 1.7 - 2.4 mg/dL     FHR: 135 bpm, Mod Var, Neg Decels, Neg Accels UC: Not adequate  Assessment: IUP at [redacted]w[redacted]d Cat I FT PreEclampsia Pitocin Augmentation  Plan: -Nurse instructed to withhold restarting MgSO4 -Nurse instructed to contact provider once MVUs become adequate x 2 hours -Dr. Carmon Sails updated on results; agrees with POC -Advised to repeat all [place] labs  at 0400; If magnesium levels 5mg /dL restart MgSO4 infusion at 0.5gr/hr -Continue other mgmt as ordered  Milinda Cave, CNM 05/23/2015 12:44 AM

## 2015-05-23 NOTE — Transfer of Care (Signed)
Immediate Anesthesia Transfer of Care Note  Patient: [name]  Procedure(s) Performed: Procedure(s): CESAREAN SECTION (N/A)  Patient Location: PACU  Anesthesia Type:Epidural  Level of Consciousness: awake  Airway & Oxygen Therapy: Patient Spontanous Breathing  Post-op Assessment: Report given to RN  Post vital signs: Reviewed and stable  Last Vitals:  Filed Vitals:   05/23/15 1330  BP: 141/87  Pulse: 102  Temp:   Resp:     Complications: No apparent anesthesia complications

## 2015-05-23 NOTE — Progress Notes (Signed)
Exie Chrismer MRN: [id number]  Subjective: -Patient resting in bed.  Continues to deny headache, visual disturbances, epigastric pain, edema (numbness/tingling), SOB, pain, and feeling ill.    Objective: BP 146/92 mmHg  Pulse 98  Temp(Src) 97.9 F (36.6 C) (Oral)  Resp 20  Ht 5\' 5"  (1.651 m)  Wt 78.926 kg (174 lb)  BMI 28.96 kg/m2  SpO2 100%  LMP 08/18/2014 I/O last 3 completed shifts: In: 4954.6 [P.O.:1110; I.V.:3094.6; IV Piggyback:750] Out: 9678 [Urine:1470] Total I/O In: 100 [IV Piggyback:100] Out: 52 [Urine:850]   Results for orders placed or performed during the hospital encounter of 05/21/15 (from the past 24 hour(s))  Magnesium     Status: Abnormal   Collection Time: 05/22/15  8:20 AM  Result Value Ref Range   Magnesium 6.9 (HH) 1.7 - 2.4 mg/dL  CBC     Status: Abnormal   Collection Time: 05/22/15  8:20 AM  Result Value Ref Range   WBC 22.6 (H) 4.0 - 10.5 K/uL   RBC 4.28 3.87 - 5.11 MIL/uL   Hemoglobin 12.5 12.0 - 15.0 g/dL   HCT 37.4 36.0 - 46.0 %   MCV 87.4 78.0 - 100.0 fL   MCH 29.2 26.0 - 34.0 pg   MCHC 33.4 30.0 - 36.0 g/dL   RDW 15.6 (H) 11.5 - 15.5 %   Platelets 181 150 - 400 K/uL  CBC     Status: Abnormal   Collection Time: 05/22/15  5:30 PM  Result Value Ref Range   WBC 21.1 (H) 4.0 - 10.5 K/uL   RBC 4.27 3.87 - 5.11 MIL/uL   Hemoglobin 12.2 12.0 - 15.0 g/dL   HCT 37.4 36.0 - 46.0 %   MCV 87.6 78.0 - 100.0 fL   MCH 28.6 26.0 - 34.0 pg   MCHC 32.6 30.0 - 36.0 g/dL   RDW 15.7 (H) 11.5 - 15.5 %   Platelets 191 150 - 400 K/uL  Magnesium     Status: Abnormal   Collection Time: 05/22/15  5:30 PM  Result Value Ref Range   Magnesium 8.6 (HH) 1.7 - 2.4 mg/dL  Magnesium     Status: Abnormal   Collection Time: 05/22/15 11:10 PM  Result Value Ref Range   Magnesium 7.2 (HH) 1.7 - 2.4 mg/dL  CBC with Differential/Platelet     Status: Abnormal   Collection Time: 05/23/15  4:22 AM  Result Value Ref Range   WBC 18.5 (H) 4.0 - 10.5 K/uL   RBC 3.98  3.87 - 5.11 MIL/uL   Hemoglobin 11.5 (L) 12.0 - 15.0 g/dL   HCT 35.1 (L) 36.0 - 46.0 %   MCV 88.2 78.0 - 100.0 fL   MCH 28.9 26.0 - 34.0 pg   MCHC 32.8 30.0 - 36.0 g/dL   RDW 15.7 (H) 11.5 - 15.5 %   Platelets 176 150 - 400 K/uL   Neutrophils Relative % 88 (H) 43 - 77 %   Neutro Abs 16.4 (H) 1.7 - 7.7 K/uL   Lymphocytes Relative 8 (L) 12 - 46 %   Lymphs Abs 1.4 0.7 - 4.0 K/uL   Monocytes Relative 4 3 - 12 %   Monocytes Absolute 0.7 0.1 - 1.0 K/uL   Eosinophils Relative 0 0 - 5 %   Eosinophils Absolute 0.0 0.0 - 0.7 K/uL   Basophils Relative 0 0 - 1 %   Basophils Absolute 0.0 0.0 - 0.1 K/uL  Comprehensive metabolic panel     Status: Abnormal   Collection Time: 05/23/15  4:22  AM  Result Value Ref Range   Sodium 129 (L) 135 - 145 mmol/L   Potassium 3.9 3.5 - 5.1 mmol/L   Chloride 103 101 - 111 mmol/L   CO2 15 (L) 22 - 32 mmol/L   Glucose, Bld 133 (H) 65 - 99 mg/dL   BUN 15 6 - 20 mg/dL   Creatinine, Ser 1.63 (H) 0.44 - 1.00 mg/dL   Calcium 7.8 (L) 8.9 - 10.3 mg/dL   Total Protein 6.4 (L) 6.5 - 8.1 g/dL   Albumin 2.6 (L) 3.5 - 5.0 g/dL   AST 31 15 - 41 U/L   ALT 19 14 - 54 U/L   Alkaline Phosphatase 232 (H) 38 - 126 U/L   Total Bilirubin 0.7 0.3 - 1.2 mg/dL   GFR calc non Af Amer 39 (L) >60 mL/min   GFR calc Af Amer 45 (L) >60 mL/min   Anion gap 11 5 - 15  Lactate dehydrogenase     Status: None   Collection Time: 05/23/15  4:22 AM  Result Value Ref Range   LDH 159 98 - 192 U/L  Magnesium     Status: Abnormal   Collection Time: 05/23/15  4:22 AM  Result Value Ref Range   Magnesium 5.8 (H) 1.7 - 2.4 mg/dL  Uric acid     Status: Abnormal   Collection Time: 05/23/15  4:22 AM  Result Value Ref Range   Uric Acid, Serum 8.6 (H) 2.3 - 6.6 mg/dL  Protein / creatinine ratio, urine     Status: Abnormal   Collection Time: 05/23/15  5:13 AM  Result Value Ref Range   Creatinine, Urine 43.00 mg/dL   Total Protein, Urine 28 mg/dL   Protein Creatinine Ratio 0.65 (H) 0.00 - 0.15  mg/mg[Cre]    FHT: 140 bpm, Mod Var, -Decels, -Accels UC: Q1-47min, palpates mild, MVUs 154mmHg   SVE:   Dilation: 3.5 Effacement (%): 50 Station: -3 Exam by:: Milinda Cave, CNM Membranes:SROM x 47hrs Pitocin:44mUn/min  Assessment:  IUP at 39.5wks Cat I FT  PreEclampsia GBS Positive Pitocin Augmentation  Plan: -Continue to titrate pitocin until contractions adequate -MgSO4 restarted at 0545 -Dr. Carmon Sails updated on patient progress and lab results; No new orders given -Continue other mgmt as ordered  Ryin Ambrosius LYNN,MSN, CNM 05/23/2015, 6:39 AM

## 2015-05-24 ENCOUNTER — Encounter (HOSPITAL_COMMUNITY): Payer: Self-pay | Admitting: Obstetrics and Gynecology

## 2015-05-24 LAB — CBC
HCT: 28.2 % — ABNORMAL LOW (ref 36.0–46.0)
HCT: 28.2 % — ABNORMAL LOW (ref 36.0–46.0)
HEMOGLOBIN: 9.2 g/dL — AB (ref 12.0–15.0)
Hemoglobin: 8.9 g/dL — ABNORMAL LOW (ref 12.0–15.0)
MCH: 28.5 pg (ref 26.0–34.0)
MCH: 27.7 pg (ref 26.0–34.0)
MCHC: 32.6 g/dL (ref 30.0–36.0)
MCHC: 31.6 g/dL (ref 30.0–36.0)
MCV: 87.3 fL (ref 78.0–100.0)
MCV: 87.9 fL (ref 78.0–100.0)
Platelets: 148 10*3/uL — ABNORMAL LOW (ref 150–400)
Platelets: 166 10*3/uL (ref 150–400)
RBC: 3.23 MIL/uL — ABNORMAL LOW (ref 3.87–5.11)
RBC: 3.21 MIL/uL — ABNORMAL LOW (ref 3.87–5.11)
RDW: 15.6 % — ABNORMAL HIGH (ref 11.5–15.5)
RDW: 15.7 % — AB (ref 11.5–15.5)
WBC: 20.4 10*3/uL — ABNORMAL HIGH (ref 4.0–10.5)
WBC: 20.6 10*3/uL — ABNORMAL HIGH (ref 4.0–10.5)

## 2015-05-24 LAB — COMPREHENSIVE METABOLIC PANEL
ALBUMIN: 2.2 g/dL — AB (ref 3.5–5.0)
ALT: 13 U/L — AB (ref 14–54)
ALT: 14 U/L (ref 14–54)
AST: 26 U/L (ref 15–41)
AST: 30 U/L (ref 15–41)
Albumin: 2.2 g/dL — ABNORMAL LOW (ref 3.5–5.0)
Alkaline Phosphatase: 173 U/L — ABNORMAL HIGH (ref 38–126)
Alkaline Phosphatase: 161 U/L — ABNORMAL HIGH (ref 38–126)
Anion gap: 4 — ABNORMAL LOW (ref 5–15)
Anion gap: 3 — ABNORMAL LOW (ref 5–15)
BUN: 19 mg/dL (ref 6–20)
BUN: 23 mg/dL — ABNORMAL HIGH (ref 6–20)
CALCIUM: 7.4 mg/dL — AB (ref 8.9–10.3)
CO2: 21 mmol/L — ABNORMAL LOW (ref 22–32)
CO2: 22 mmol/L (ref 22–32)
CREATININE: 1.66 mg/dL — AB (ref 0.44–1.00)
Calcium: 7.6 mg/dL — ABNORMAL LOW (ref 8.9–10.3)
Chloride: 106 mmol/L (ref 101–111)
Chloride: 110 mmol/L (ref 101–111)
Creatinine, Ser: 1.75 mg/dL — ABNORMAL HIGH (ref 0.44–1.00)
GFR calc Af Amer: 41 mL/min — ABNORMAL LOW (ref >60)
GFR calc Af Amer: 44 mL/min — ABNORMAL LOW (ref >60)
GFR calc non Af Amer: 36 mL/min — ABNORMAL LOW (ref >60)
GFR calc non Af Amer: 38 mL/min — ABNORMAL LOW (ref >60)
GLUCOSE: 100 mg/dL — AB (ref 65–99)
Glucose, Bld: 93 mg/dL (ref 65–99)
POTASSIUM: 4.6 mmol/L (ref 3.5–5.1)
Potassium: 4.3 mmol/L (ref 3.5–5.1)
SODIUM: 131 mmol/L — AB (ref 135–145)
Sodium: 135 mmol/L (ref 135–145)
TOTAL PROTEIN: 5.6 g/dL — AB (ref 6.5–8.1)
Total Bilirubin: 0.3 mg/dL (ref 0.3–1.2)
Total Bilirubin: 0.3 mg/dL (ref 0.3–1.2)
Total Protein: 5.4 g/dL — ABNORMAL LOW (ref 6.5–8.1)

## 2015-05-24 LAB — CCBB MATERNAL DONOR DRAW

## 2015-05-24 LAB — MAGNESIUM: MAGNESIUM: 5 mg/dL — AB (ref 1.7–2.4)

## 2015-05-24 MED ORDER — SODIUM CHLORIDE 0.9 % IJ SOLN: 3.0000 mL | INTRAMUSCULAR | Status: DC | PRN

## 2015-05-24 MED ORDER — SODIUM CHLORIDE 0.9 % IJ SOLN
3.0000 mL | Freq: Two times a day (BID) | INTRAMUSCULAR | Status: DC
  Administered 2015-05-24: 3 mL via INTRAVENOUS

## 2015-05-24 NOTE — Lactation Note (Signed)
This note was copied from the chart of Mackenzie Leslye Ewen. Lactation Consultation Note  Initial visit done.  Breastfeeding consultation services and support information given to patient.  This is mom's first baby and she states baby has been latching well.  Offered feeding assist and mom agrees.  Placed baby skin to skin in football hold.  Taught mom hand expression and small drop visible.  Baby opened wide and latched well after a few attempts.  Baby nursed actively with audible swallows.  Instructed on waking techniques and breast massage for a more effective feeding.  Encouraged to feed with any hunger cue and to call with concerns/assist prn.  Patient Name: [name] [id number] Date: 05/24/2015 Reason for consult: Initial assessment;Other (Comment) (MOM IN AICU)   Maternal Data    Feeding Feeding Type: Breast Fed Length of feed: 15 min  LATCH Score/Interventions Latch: Grasps breast easily, tongue down, lips flanged, rhythmical sucking. Intervention(s): Skin to skin;Teach feeding cues;Waking techniques Intervention(s): Breast compression;Breast massage;Assist with latch;Adjust position  Audible Swallowing: Spontaneous and intermittent Intervention(s): Skin to skin;Hand expression;Alternate breast massage  Type of Nipple: Everted at rest and after stimulation  Comfort (Breast/Nipple): Soft / non-tender     Hold (Positioning): Assistance needed to correctly position infant at breast and maintain latch. Intervention(s): Breastfeeding basics reviewed;Support Pillows;Position options;Skin to skin  LATCH Score: 9  Lactation Tools Discussed/Used     Consult Status Consult Status: Follow-up Date: 05/25/15 Follow-up type: In-patient    Ave Filter 05/24/2015, 10:38 AM

## 2015-05-24 NOTE — Progress Notes (Addendum)
Ara Grandmaison [id number]  Subjective: Postpartum Day 1: Primary  LTC/S due to pre-eclampsia with severe features, FTP.  Delivered at 2:16p yesterday. Denies HA, visual sx, epigastric pain, SOB, or chest pain. Patient has stood at bedside, not ambulatory yet Feeding:  Breast Contraceptive plan:  Undecided  Family plans inpatient circumcision.  Objective: Temp:  [97.7 F (36.5 C)-98.8 F (37.1 C)] 98.5 F (36.9 C) (07/14 0400) Pulse Rate:  [58-113] 84 (07/14 0600) Resp:  [18-20] 20 (07/14 0400) BP: (118-165)/(62-99) 140/87 mmHg (07/14 0700) SpO2:  [87 %-100 %] 97 % (07/14 0600)  Filed Vitals:   05/24/15 0400 05/24/15 0500 05/24/15 0600 05/24/15 0700  BP:  138/88 128/87 140/87  Pulse:  78 84   Temp: 98.5 F (36.9 C)     TempSrc: Oral     Resp: 20     Height:      Weight:      SpO2:  98% 97%    BP range since MN:  128-158/78-96.   Recent Labs  05/23/15 0422 05/23/15 1826 05/24/15 0519  HGB 11.5* 10.3* 9.2*  HCT 35.1* 31.5* 28.2*  WBC 18.5* 20.4* 20.4*   Magnesium level 5.  Currently on 0.5 mg/hr. Magnesium level had been 8.6 at peak on 05/22/15.  CMP Latest Ref Rng 05/24/2015 05/23/2015 05/23/2015  Glucose 65 - 99 mg/dL 93 141(H) 133(H)  BUN 6 - 20 mg/dL 19 17 15   Creatinine 0.44 - 1.00 mg/dL 1.75(H) 1.76(H) 1.63(H)  Sodium 135 - 145 mmol/L 131(L) 130(L) 129(L)  Potassium 3.5 - 5.1 mmol/L 4.6 4.1 3.9  Chloride 101 - 111 mmol/L 106 106 103  CO2 22 - 32 mmol/L 21(L) 18(L) 15(L)  Calcium 8.9 - 10.3 mg/dL 7.4(L) 7.6(L) 7.8(L)  Total Protein 6.5 - 8.1 g/dL 5.4(L) 5.7(L) 6.4(L)  Total Bilirubin 0.3 - 1.2 mg/dL 0.3 0.5 0.7  Alkaline Phos 38 - 126 U/L 173(H) 201(H) 232(H)  AST 15 - 41 U/L 26 27 31   ALT 14 - 54 U/L 13(L) 15 19   I/O balance:  - 1538 cc Urine output last shift  Physical Exam:  General: alert Lochia: appropriate Uterine Fundus: firm Abdomen:  + bowel sounds, mild gaseous distention Incision: Honeycomb dressing CDI DVT Evaluation: No evidence  of DVT seen on physical exam. Negative Homan's sign. DTR 2+, no clonus, minimal edema. Foley draining clear urine.   Assessment/Plan: Status post cesarean delivery, day 1--FTP Pre-eclampsia with severe features On magnesium for 24 hours post-delivery Elevated serum creatnine--appropriate urine output. Leukocytosis Mild anemia  Plan: Fe BID D/C magnesium at 2:16p Repeat CBC, CMP, magnesium 12 hours after am labs, then in am. Transfer to floor 4 hours after mag d/c'd, if stable.   Donnel Saxon MSN, CNM 05/24/2015, 8:38 AM   I saw and examined patient at bedside and agree with above findings, assessment and plan.  Dr.  Alesia Richards.

## 2015-05-24 NOTE — Progress Notes (Signed)
UR chart review completed.  

## 2015-05-24 NOTE — Anesthesia Postprocedure Evaluation (Signed)
Anesthesia Post Note  Patient: [name]  Procedure(s) Performed: Procedure(s) (LRB): CESAREAN SECTION (N/A)  Anesthesia type: Epidural  Patient location: Mother/Baby  Post pain: Pain level controlled  Post assessment: Post-op Vital signs reviewed  Last Vitals:  Filed Vitals:   05/24/15 0700  BP: 140/87  Pulse:   Temp:   Resp:     Post vital signs: Reviewed  Level of consciousness:alert  Complications: No apparent anesthesia complications

## 2015-05-24 NOTE — Addendum Note (Signed)
Addendum  created 05/24/15 0827 by Flossie Dibble, CRNA   Modules edited: Notes Section   Notes Section:  File: [id number]

## 2015-05-25 DIAGNOSIS — Z98891 History of uterine scar from previous surgery: Secondary | ICD-10-CM

## 2015-05-25 LAB — CBC
HCT: 25.5 % — ABNORMAL LOW (ref 36.0–46.0)
HEMOGLOBIN: 8.2 g/dL — AB (ref 12.0–15.0)
MCH: 28.5 pg (ref 26.0–34.0)
MCHC: 32.2 g/dL (ref 30.0–36.0)
MCV: 88.5 fL (ref 78.0–100.0)
PLATELETS: 167 10*3/uL (ref 150–400)
RBC: 2.88 MIL/uL — ABNORMAL LOW (ref 3.87–5.11)
RDW: 15.9 % — ABNORMAL HIGH (ref 11.5–15.5)
WBC: 19.7 10*3/uL — ABNORMAL HIGH (ref 4.0–10.5)

## 2015-05-25 LAB — COMPREHENSIVE METABOLIC PANEL
ALBUMIN: 2 g/dL — AB (ref 3.5–5.0)
ALK PHOS: 145 U/L — AB (ref 38–126)
ALT: 15 U/L (ref 14–54)
AST: 33 U/L (ref 15–41)
Anion gap: 4 — ABNORMAL LOW (ref 5–15)
BILIRUBIN TOTAL: 0.2 mg/dL — AB (ref 0.3–1.2)
BUN: 27 mg/dL — ABNORMAL HIGH (ref 6–20)
CO2: 21 mmol/L — AB (ref 22–32)
CREATININE: 1.42 mg/dL — AB (ref 0.44–1.00)
Calcium: 7.6 mg/dL — ABNORMAL LOW (ref 8.9–10.3)
Chloride: 110 mmol/L (ref 101–111)
GFR calc Af Amer: 53 mL/min — ABNORMAL LOW (ref >60)
GFR, EST NON AFRICAN AMERICAN: 46 mL/min — AB (ref >60)
GLUCOSE: 78 mg/dL (ref 65–99)
POTASSIUM: 4.5 mmol/L (ref 3.5–5.1)
SODIUM: 135 mmol/L (ref 135–145)
Total Protein: 4.9 g/dL — ABNORMAL LOW (ref 6.5–8.1)

## 2015-05-25 NOTE — Lactation Note (Signed)
This note was copied from the chart of [name]. Lactation Consultation Note  Patient Name: Mackenzie Brilyn Tuller BTCYE'L Date: 05/25/2015 Reason for consult: Follow-up assessment   Follow-up consult at 51 hours; Infant's double photo therapy was discontinued this am.   Mom has given bottles all night and all day.  Mom reports she has not put infant to breast at all today and reports only pumping once; reports not getting anything with pumping.  Mom states she knows how to hand express colostrum. Infant has breastfed x5 (16-40 min) (yesterday) + formula x4 (1-15 ml) since 0900 yesterday morning to 0950 this morning.  Voids-3 in 24 hours/ 5 life; stools-2 in 24 hours/ 2 life. Mom did have any questions r/t breastfeeding.   [place] educated on supply and demand and maintaining a milk supply.  Encouraged mom to breastfeed first prior to offering formula. Encouraged mom to pump if she does not breastfeed for stimulation and maintain a supply. Mom stated she has a Medela DEBP at home.  LC encouraged mom to take all pump pieces with her on discharge to use with her pump at home and shown how to take circular pieces out of top of pump. Encouraged mom to call for assistance if needed.  Lactation to see mom PRN.     Consult Status Consult Status: PRN    Merlene Laughter 05/25/2015, 5:36 PM

## 2015-05-25 NOTE — Discharge Summary (Signed)
Cesarean Section Delivery Discharge Summary  [name]  DOB:    Aug 22, 1976 MRN:    [id number] CSN:    [id number]  Date of admission:                  05/21/15  Date of discharge:                   05/26/15  Procedures this admission:  CS  Date of Delivery: 05/22/15  Newborn Data:  Live born  Information for the patient's newborn:  Mackenzie, Farrell [[id number]]  female   Live born female  Birth Weight: 6 lb 5.2 oz (2869 g) APGAR: 8, 8  Home with mother. Name: CJ Circumcision Plan: i patient  History of Present Illness:  [name] is a 39 y.o. female, G2P1011, who presents at 58w5dweeks gestation. [facility]    Her pregnancy has been complicated by:  Patient Active Problem List   Diagnosis Date Noted  . Status post primary low transverse cesarean section 05/25/2015  . Severe pre-eclampsia 05/23/2015  . Positive GBS test 05/21/2015  . AMA (advanced maternal age) multigravida 35+ 05/21/2015  . Fibroids 05/21/2015  . History of imperforate anus--surgery 1976 05/21/2015  . 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   Urinalysis, Routine w reflex microscopic (not [facility]     Status: Abnormal   Collection Time: 05/21/15  4:55 PM  Result Value Ref Range   Color, Urine YELLOW YELLOW   APPearance CLEAR CLEAR   Specific Gravity, Urine <1.005 (L) 1.005 - 1.030   pH 5.5 5.0 - 8.0   Glucose, UA NEGATIVE NEGATIVE mg/dL   Hgb urine dipstick LARGE (A) NEGATIVE   Bilirubin Urine NEGATIVE NEGATIVE   Ketones, ur NEGATIVE NEGATIVE mg/dL   Protein, ur NEGATIVE NEGATIVE mg/dL   Urobilinogen, UA 0.2 0.0 - 1.0 mg/dL   Nitrite NEGATIVE NEGATIVE   Leukocytes, UA SMALL (A) NEGATIVE  Urine microscopic-add on     Status: Abnormal   Collection Time: 05/21/15  4:55 PM  Result Value Ref Range   Squamous Epithelial / LPF RARE RARE   WBC, UA 0-2 <3 WBC/hpf   RBC / HPF 0-2 <3 RBC/hpf   Bacteria, UA FEW (A) RARE  Comprehensive metabolic panel     Status: Abnormal   Collection Time: 05/21/15  5:46 PM  Result Value Ref Range   Sodium 135 135 - 145 mmol/L   Potassium 4.2 3.5 - 5.1 mmol/L   Chloride 110 101 - 111 mmol/L   CO2 18 (L) 22 - 32 mmol/L   Glucose, Bld 80 65 - 99 mg/dL   BUN 15 6 - 20 mg/dL   Creatinine, Ser 1.30 (H) 0.44 - 1.00  mg/dL   Calcium 9.2 8.9 - 10.3 mg/dL   Total Protein 6.4 (L) 6.5 - 8.1 g/dL   Albumin 2.9 (L) 3.5 - 5.0 g/dL   AST 37 15 - 41 U/L   ALT 25 14 - 54 U/L   Alkaline Phosphatase 261 (H) 38 - 126 U/L   Total Bilirubin 0.5 0.3 - 1.2 mg/dL   GFR calc non Af Amer 51 (L) >60 mL/min   GFR calc Af Amer 59 (L) >60 mL/min    Comment: (NOTE) The eGFR has been calculated using the CKD EPI equation. This calculation has not been validated in all clinical situations. eGFR's persistently <60 mL/min signify possible Chronic Kidney Disease.    Anion gap 7 5 - 15  CBC     Status: Abnormal   Collection Time: 05/21/15  5:46 PM  Result Value Ref Range   WBC 13.7 (H) 4.0 - 10.5 K/uL   RBC 4.20 3.87 - 5.11 MIL/uL   Hemoglobin 12.0 12.0 - 15.0 g/dL   HCT 36.8 36.0 - 46.0 %   MCV 87.6 78.0 - 100.0 fL   MCH 28.6 26.0 - 34.0 pg   MCHC 32.6 30.0 - 36.0 g/dL   RDW 15.4 11.5 - 15.5 %   Platelets 191 150 - 400 K/uL  Lactate dehydrogenase     Status: Abnormal   Collection  Time: 05/21/15  5:46 PM  Result Value Ref Range   LDH 237 (H) 98 - 192 U/L  Uric acid     Status: Abnormal   Collection Time: 05/21/15  5:46 PM  Result Value Ref Range   Uric Acid, Serum 8.1 (H) 2.3 - 6.6 mg/dL  HIV antibody     Status: None   Collection Time: 05/21/15  5:46 PM  Result Value Ref Range   HIV Screen 4th Generation wRfx Non Reactive Non Reactive    Comment: (NOTE) Performed At: [facility] [address] [place], [place] [id number] Lindon Romp MD [id number]   RPR     Status: None   Collection Time: 05/21/15  5:46 PM  Result Value Ref Range   RPR Ser Ql Non Reactive Non Reactive    Comment: (NOTE) Performed At: [facility] [place], [place] [id number] Lindon Romp MD [id number]   Type and screen     Status: None   Collection Time: 05/21/15  5:46 PM  Result Value Ref Range   ABO/RH(D) O POS    Antibody Screen NEG    Sample Expiration 05/24/2015   ABO/Rh     Status: None   Collection Time: 05/21/15  5:46 PM  Result Value Ref Range   ABO/RH(D) O POS   CBC     Status: Abnormal   Collection Time: 05/21/15 11:46 PM  Result Value Ref Range   WBC 17.6 (H) 4.0 - 10.5 K/uL   RBC 4.23 3.87 - 5.11 MIL/uL   Hemoglobin 12.1 12.0 - 15.0 g/dL   HCT 36.8 36.0 - 46.0 %   MCV 87.0 78.0 - 100.0 fL   MCH 28.6 26.0 - 34.0 pg   MCHC 32.9 30.0 - 36.0 g/dL   RDW 15.6 (H) 11.5 - 15.5 %   Platelets 176 150 - 400 K/uL  Comprehensive metabolic panel     Status: Abnormal   Collection Time: 05/21/15 11:46 PM  Result Value Ref Range   Sodium 133 (L) 135 - 145 mmol/L   Potassium 3.9 3.5 - 5.1 mmol/L   Chloride 110 101 -  111 mmol/L   CO2 18 (L) 22 - 32 mmol/L   Glucose, Bld 97 65 - 99 mg/dL   BUN 13 6 - 20 mg/dL   Creatinine, Ser 1.32 (H) 0.44 - 1.00 mg/dL   Calcium 8.7 (L) 8.9 - 10.3 mg/dL   Total Protein 6.5 6.5 - 8.1 g/dL   Albumin 2.8 (L) 3.5 - 5.0 g/dL   AST 36 15 - 41 U/L   ALT 23 14 - 54 U/L   Alkaline Phosphatase 244 (H)  38 - 126 U/L   Total Bilirubin 0.6 0.3 - 1.2 mg/dL   GFR calc non Af Amer 50 (L) >60 mL/min   GFR calc Af Amer 58 (L) >60 mL/min    Comment: (NOTE) The eGFR has been calculated using the CKD EPI equation. This calculation has not been validated in all clinical situations. eGFR's persistently <60 mL/min signify possible Chronic Kidney Disease.    Anion gap 5 5 - 15  Lactate dehydrogenase     Status: None   Collection Time: 05/21/15 11:46 PM  Result Value Ref Range   LDH 188 98 - 192 U/L  Uric acid     Status: Abnormal   Collection Time: 05/21/15 11:46 PM  Result Value Ref Range   Uric Acid, Serum 7.9 (H) 2.3 - 6.6 mg/dL  Magnesium     Status: Abnormal   Collection Time: 05/22/15  8:20 AM  Result Value Ref Range   Magnesium 6.9 (HH) 1.7 - 2.4 mg/dL    Comment: RESULTS CONFIRMED BY MANUAL DILUTION CRITICAL RESULT CALLED TO, READ BACK BY AND VERIFIED WITH: HOLLEMAN,S _0  ON [id number] BY FLEMINGS   CBC     Status: Abnormal   Collection Time: 05/22/15  8:20 AM  Result Value Ref Range   WBC 22.6 (H) 4.0 - 10.5 K/uL   RBC 4.28 3.87 - 5.11 MIL/uL   Hemoglobin 12.5 12.0 - 15.0 g/dL   HCT 37.4 36.0 - 46.0 %   MCV 87.4 78.0 - 100.0 fL   MCH 29.2 26.0 - 34.0 pg   MCHC 33.4 30.0 - 36.0 g/dL   RDW 15.6 (H) 11.5 - 15.5 %   Platelets 181 150 - 400 K/uL  CBC     Status: Abnormal   Collection Time: 05/22/15  5:30 PM  Result Value Ref Range   WBC 21.1 (H) 4.0 - 10.5 K/uL   RBC 4.27 3.87 - 5.11 MIL/uL   Hemoglobin 12.2 12.0 - 15.0 g/dL   HCT 37.4 36.0 - 46.0 %   MCV 87.6 78.0 - 100.0 fL   MCH 28.6 26.0 - 34.0 pg   MCHC 32.6 30.0 - 36.0 g/dL   RDW 15.7 (H) 11.5 - 15.5 %   Platelets 191 150 - 400 K/uL  Magnesium     Status: Abnormal   Collection Time: 05/22/15  5:30 PM  Result Value Ref Range   Magnesium 8.6 (HH) 1.7 - 2.4 mg/dL    Comment: RESULTS CONFIRMED BY MANUAL DILUTION CRITICAL RESULT CALLED TO, READ BACK BY AND VERIFIED WITH: HOLLEMAN,S AT [employer] ON 05/22/15 BY HAGGINSC     Magnesium     Status: Abnormal   Collection Time: 05/22/15 11:10 PM  Result Value Ref Range   Magnesium 7.2 (HH) 1.7 - 2.4 mg/dL    Comment: RESULTS CONFIRMED BY MANUAL DILUTION CRITICAL RESULT CALLED TO, READ BACK BY AND VERIFIED WITH: E SISKA 05/23/15 AT 0036 BY H SOEWARDIMAN   CBC with Differential/Platelet     Status: Abnormal  Collection Time: 05/23/15  4:22 AM  Result Value Ref Range   WBC 18.5 (H) 4.0 - 10.5 K/uL   RBC 3.98 3.87 - 5.11 MIL/uL   Hemoglobin 11.5 (L) 12.0 - 15.0 g/dL   HCT 35.1 (L) 36.0 - 46.0 %   MCV 88.2 78.0 - 100.0 fL   MCH 28.9 26.0 - 34.0 pg   MCHC 32.8 30.0 - 36.0 g/dL   RDW 15.7 (H) 11.5 - 15.5 %   Platelets 176 150 - 400 K/uL   Neutrophils Relative % 88 (H) 43 - 77 %   Neutro Abs 16.4 (H) 1.7 - 7.7 K/uL   Lymphocytes Relative 8 (L) 12 - 46 %   Lymphs Abs 1.4 0.7 - 4.0 K/uL   Monocytes Relative 4 3 - 12 %   Monocytes Absolute 0.7 0.1 - 1.0 K/uL   Eosinophils Relative 0 0 - 5 %   Eosinophils Absolute 0.0 0.0 - 0.7 K/uL   Basophils Relative 0 0 - 1 %   Basophils Absolute 0.0 0.0 - 0.1 K/uL  Comprehensive metabolic panel     Status: Abnormal   Collection Time: 05/23/15  4:22 AM  Result Value Ref Range   Sodium 129 (L) 135 - 145 mmol/L   Potassium 3.9 3.5 - 5.1 mmol/L   Chloride 103 101 - 111 mmol/L   CO2 15 (L) 22 - 32 mmol/L   Glucose, Bld 133 (H) 65 - 99 mg/dL   BUN 15 6 - 20 mg/dL   Creatinine, Ser 1.63 (H) 0.44 - 1.00 mg/dL   Calcium 7.8 (L) 8.9 - 10.3 mg/dL   Total Protein 6.4 (L) 6.5 - 8.1 g/dL   Albumin 2.6 (L) 3.5 - 5.0 g/dL   AST 31 15 - 41 U/L   ALT 19 14 - 54 U/L   Alkaline Phosphatase 232 (H) 38 - 126 U/L   Total Bilirubin 0.7 0.3 - 1.2 mg/dL   GFR calc non Af Amer 39 (L) >60 mL/min   GFR calc Af Amer 45 (L) >60 mL/min    Comment: (NOTE) The eGFR has been calculated using the CKD EPI equation. This calculation has not been validated in all clinical situations. eGFR's persistently <60 mL/min signify possible Chronic  Kidney Disease.    Anion gap 11 5 - 15  Lactate dehydrogenase     Status: None   Collection Time: 05/23/15  4:22 AM  Result Value Ref Range   LDH 159 98 - 192 U/L  Magnesium     Status: Abnormal   Collection Time: 05/23/15  4:22 AM  Result Value Ref Range   Magnesium 5.8 (H) 1.7 - 2.4 mg/dL  Uric acid     Status: Abnormal   Collection Time: 05/23/15  4:22 AM  Result Value Ref Range   Uric Acid, Serum 8.6 (H) 2.3 - 6.6 mg/dL  Protein / creatinine ratio, urine     Status: Abnormal   Collection Time: 05/23/15  5:13 AM  Result Value Ref Range   Creatinine, Urine 43.00 mg/dL   Total Protein, Urine 28 mg/dL    Comment: NO NORMAL RANGE ESTABLISHED FOR THIS TEST   Protein Creatinine Ratio 0.65 (H) 0.00 - 0.15 mg/mg[Cre]  Protein / creatinine ratio, urine     Status: Abnormal   Collection Time: 05/23/15  9:50 AM  Result Value Ref Range   Creatinine, Urine 44.00 mg/dL   Total Protein, Urine 27 mg/dL    Comment: NO NORMAL RANGE ESTABLISHED FOR THIS TEST   Protein Creatinine  Ratio 0.61 (H) 0.00 - 0.15 mg/mg[Cre]  Magnesium     Status: Abnormal   Collection Time: 05/23/15 10:45 AM  Result Value Ref Range   Magnesium 5.6 (H) 1.7 - 2.4 mg/dL  CBC     Status: Abnormal   Collection Time: 05/23/15  6:26 PM  Result Value Ref Range   WBC 20.4 (H) 4.0 - 10.5 K/uL   RBC 3.58 (L) 3.87 - 5.11 MIL/uL   Hemoglobin 10.3 (L) 12.0 - 15.0 g/dL   HCT 31.5 (L) 36.0 - 46.0 %   MCV 88.0 78.0 - 100.0 fL   MCH 28.8 26.0 - 34.0 pg   MCHC 32.7 30.0 - 36.0 g/dL   RDW 15.8 (H) 11.5 - 15.5 %   Platelets 159 150 - 400 K/uL  Comprehensive metabolic panel     Status: Abnormal   Collection Time: 05/23/15  6:26 PM  Result Value Ref Range   Sodium 130 (L) 135 - 145 mmol/L   Potassium 4.1 3.5 - 5.1 mmol/L   Chloride 106 101 - 111 mmol/L   CO2 18 (L) 22 - 32 mmol/L   Glucose, Bld 141 (H) 65 - 99 mg/dL   BUN 17 6 - 20 mg/dL   Creatinine, Ser 1.76 (H) 0.44 - 1.00 mg/dL   Calcium 7.6 (L) 8.9 - 10.3 mg/dL    Total Protein 5.7 (L) 6.5 - 8.1 g/dL   Albumin 2.3 (L) 3.5 - 5.0 g/dL   AST 27 15 - 41 U/L   ALT 15 14 - 54 U/L   Alkaline Phosphatase 201 (H) 38 - 126 U/L   Total Bilirubin 0.5 0.3 - 1.2 mg/dL   GFR calc non Af Amer 35 (L) >60 mL/min   GFR calc Af Amer 41 (L) >60 mL/min    Comment: (NOTE) The eGFR has been calculated using the CKD EPI equation. This calculation has not been validated in all clinical situations. eGFR's persistently <60 mL/min signify possible Chronic Kidney Disease.    Anion gap 6 5 - 15  Magnesium     Status: Abnormal   Collection Time: 05/23/15  6:26 PM  Result Value Ref Range   Magnesium 5.2 (H) 1.7 - 2.4 mg/dL  [place] [employer] maternl donor drw     Status: None   Collection Time: 05/24/15  5:19 AM  Result Value Ref Range   [facility] Maternal Donor Draw COLLECTED BY LABORATORY     Comment:  DP  CBC     Status: Abnormal   Collection Time: 05/24/15  5:19 AM  Result Value Ref Range   WBC 20.4 (H) 4.0 - 10.5 K/uL   RBC 3.23 (L) 3.87 - 5.11 MIL/uL   Hemoglobin 9.2 (L) 12.0 - 15.0 g/dL   HCT 28.2 (L) 36.0 - 46.0 %   MCV 87.3 78.0 - 100.0 fL   MCH 28.5 26.0 - 34.0 pg   MCHC 32.6 30.0 - 36.0 g/dL   RDW 15.6 (H) 11.5 - 15.5 %   Platelets 148 (L) 150 - 400 K/uL  Comprehensive metabolic panel     Status: Abnormal   Collection Time: 05/24/15  5:19 AM  Result Value Ref Range   Sodium 131 (L) 135 - 145 mmol/L   Potassium 4.6 3.5 - 5.1 mmol/L   Chloride 106 101 - 111 mmol/L   CO2 21 (L) 22 - 32 mmol/L   Glucose, Bld 93 65 - 99 mg/dL   BUN 19 6 - 20 mg/dL   Creatinine, Ser 1.75 (H) 0.44 -  1.00 mg/dL   Calcium 7.4 (L) 8.9 - 10.3 mg/dL   Total Protein 5.4 (L) 6.5 - 8.1 g/dL   Albumin 2.2 (L) 3.5 - 5.0 g/dL   AST 26 15 - 41 U/L   ALT 13 (L) 14 - 54 U/L   Alkaline Phosphatase 173 (H) 38 - 126 U/L   Total Bilirubin 0.3 0.3 - 1.2 mg/dL   GFR calc non Af Amer 36 (L) >60 mL/min   GFR calc Af Amer 41 (L) >60 mL/min    Comment: (NOTE) The eGFR has been calculated  using the CKD EPI equation. This calculation has not been validated in all clinical situations. eGFR's persistently <60 mL/min signify possible Chronic Kidney Disease.    Anion gap 4 (L) 5 - 15  Magnesium     Status: Abnormal   Collection Time: 05/24/15  5:19 AM  Result Value Ref Range   Magnesium 5.0 (H) 1.7 - 2.4 mg/dL  CBC     Status: Abnormal   Collection Time: 05/24/15  4:59 PM  Result Value Ref Range   WBC 20.6 (H) 4.0 - 10.5 K/uL   RBC 3.21 (L) 3.87 - 5.11 MIL/uL   Hemoglobin 8.9 (L) 12.0 - 15.0 g/dL   HCT 28.2 (L) 36.0 - 46.0 %   MCV 87.9 78.0 - 100.0 fL   MCH 27.7 26.0 - 34.0 pg   MCHC 31.6 30.0 - 36.0 g/dL   RDW 15.7 (H) 11.5 - 15.5 %   Platelets 166 150 - 400 K/uL  Comprehensive metabolic panel     Status: Abnormal   Collection Time: 05/24/15  4:59 PM  Result Value Ref Range   Sodium 135 135 - 145 mmol/L   Potassium 4.3 3.5 - 5.1 mmol/L   Chloride 110 101 - 111 mmol/L   CO2 22 22 - 32 mmol/L   Glucose, Bld 100 (H) 65 - 99 mg/dL   BUN 23 (H) 6 - 20 mg/dL   Creatinine, Ser 1.66 (H) 0.44 - 1.00 mg/dL   Calcium 7.6 (L) 8.9 - 10.3 mg/dL   Total Protein 5.6 (L) 6.5 - 8.1 g/dL   Albumin 2.2 (L) 3.5 - 5.0 g/dL   AST 30 15 - 41 U/L   ALT 14 14 - 54 U/L   Alkaline Phosphatase 161 (H) 38 - 126 U/L   Total Bilirubin 0.3 0.3 - 1.2 mg/dL   GFR calc non Af Amer 38 (L) >60 mL/min   GFR calc Af Amer 44 (L) >60 mL/min    Comment: (NOTE) The eGFR has been calculated using the CKD EPI equation. This calculation has not been validated in all clinical situations. eGFR's persistently <60 mL/min signify possible Chronic Kidney Disease.    Anion gap 3 (L) 5 - 15  CBC     Status: Abnormal   Collection Time: 05/25/15  5:25 AM  Result Value Ref Range   WBC 19.7 (H) 4.0 - 10.5 K/uL   RBC 2.88 (L) 3.87 - 5.11 MIL/uL   Hemoglobin 8.2 (L) 12.0 - 15.0 g/dL   HCT 25.5 (L) 36.0 - 46.0 %   MCV 88.5 78.0 - 100.0 fL   MCH 28.5 26.0 - 34.0 pg   MCHC 32.2 30.0 - 36.0 g/dL   RDW 15.9 (H)  11.5 - 15.5 %   Platelets 167 150 - 400 K/uL  Comprehensive metabolic panel     Status: Abnormal   Collection Time: 05/25/15  5:25 AM  Result Value Ref Range   Sodium 135 135 -  145 mmol/L   Potassium 4.5 3.5 - 5.1 mmol/L   Chloride 110 101 - 111 mmol/L   CO2 21 (L) 22 - 32 mmol/L   Glucose, Bld 78 65 - 99 mg/dL   BUN 27 (H) 6 - 20 mg/dL   Creatinine, Ser 1.42 (H) 0.44 - 1.00 mg/dL   Calcium 7.6 (L) 8.9 - 10.3 mg/dL   Total Protein 4.9 (L) 6.5 - 8.1 g/dL   Albumin 2.0 (L) 3.5 - 5.0 g/dL   AST 33 15 - 41 U/L   ALT 15 14 - 54 U/L   Alkaline Phosphatase 145 (H) 38 - 126 U/L   Total Bilirubin 0.2 (L) 0.3 - 1.2 mg/dL   GFR calc non Af Amer 46 (L) >60 mL/min   GFR calc Af Amer 53 (L) >60 mL/min    Comment: (NOTE) The eGFR has been calculated using the CKD EPI equation. This calculation has not been validated in all clinical situations. eGFR's persistently <60 mL/min signify possible Chronic Kidney Disease.    Anion gap 4 (L) 5 - 15    Hospital course: The patient was admitted for labor.   Her postpartum course was complicated w/pre eclampsia on Mag.  She was discharged to home on postpartum day 3 doing well.  Feeding: breast  Contraception: unsure Pt understands the risks are but not limited to irregular bleeding, formation of DVT, fluid fluctuations, elevation in blood pressure, stroke, breast tenderness and liver damage.  She states she will report any serious side effects.  She has been given verbal and written instructions and voiced a clear understanding.    Discharge hemoglobin: HEMOGLOBIN  Date Value Ref Range Status  05/25/2015 8.2* 12.0 - 15.0 g/dL Final   HCT  Date Value Ref Range Status  05/25/2015 25.5* 36.0 - 46.0 % Final   Anemia - hemodynamicly stable.  Declined transfusion  PreNatal Labs ABO, Rh: --/--/O POS, O POS (07/11 1746)   Antibody: NEG (07/11 1746) Rubella:   immune RPR: Non Reactive (07/11 1746)  HBsAg: Negative (12/04 0000)  HIV:  Non-reactive (12/04 0000)  GBS: Positive (06/17 0000)  Discharge Physical Exam:  General: alert and cooperative Lochia: appropriate Uterine Fundus: firm Incision: no significant drainage DVT Evaluation: No evidence of DVT seen on physical exam.  Intrapartum Procedures: cesarean: low cervical, transverse Postpartum Procedures: mag x 24 hours Complications-Operative and Postpartum: none  Discharge Diagnoses: Term Pregnancy-delivered and Preelampsia,  HTN, asymptomatic anemia  Discharge Information:  Activity:           pelvic rest Diet:                routine Medications: PNV, Ibuprofen, Iron and Percocet Condition:      stable   Discharge to: home  Smart start nurse to visit for BP check on Tuesday 7/19 and Friday 7/22     Analeise Mccleery, CNM, MSN  05/25/2015. 10:01 PM  Care After Cesarean Delivery  Refer to this sheet in the next few weeks. These instructions provide you with information on caring for yourself after your procedure. Your caregiver may also give you specific instructions. Your treatment has been planned according to current medical practices, but problems sometimes occur. Call your caregiver if you have any problems or questions after you go home. HOME CARE INSTRUCTIONS  Only take over-the-counter or prescription medicines as directed by your caregiver.  Do not drink alcohol, especially if you are breastfeeding or taking medicine to relieve pain.  Do not chew or smoke tobacco.  Continue to use good perineal care. Good perineal care includes:  Wiping your perineum from front to back.  Keeping your perineum clean.  Check your cut (incision) daily for increased redness, drainage, swelling, or separation of skin.  Clean your incision gently with soap and water every day, and then pat it dry. If your caregiver says it is okay, leave the incision uncovered. Use a bandage (dressing) if the incision is draining fluid or appears irritated. If the adhesive  strips across the incision do not fall off within 7 days, carefully peel them off.  Hug a pillow when coughing or sneezing until your incision is healed. This helps to relieve pain.  Do not use tampons or douche until your caregiver says it is okay.  Shower, wash your hair, and take tub baths as directed by your caregiver.  Wear a well-fitting bra that provides breast support.  Limit wearing support panties or control-top hose.  Drink enough fluids to keep your urine clear or pale yellow.  Eat high-fiber foods such as whole grain cereals and breads, brown rice, beans, and fresh fruits and vegetables every day. These foods may help prevent or relieve constipation.  Resume activities such as climbing stairs, driving, lifting, exercising, or traveling as directed by your caregiver.  Talk to your caregiver about resuming sexual activities. This is dependent upon your risk of infection, your rate of healing, and your comfort and desire to resume sexual activity.  Try to have someone help you with your household activities and your newborn for at least a few days after you leave the hospital.  Rest as much as possible. Try to rest or take a nap when your newborn is sleeping.  Increase your activities gradually.  Keep all of your scheduled postpartum appointments. It is very important to keep your scheduled follow-up appointments. At these appointments, your caregiver will be checking to make sure that you are healing physically and emotionally. SEEK MEDICAL CARE IF:   You are passing large clots from your vagina. Save any clots to show your caregiver.  You have a foul smelling discharge from your vagina.  You have trouble urinating.  You are urinating frequently.  You have pain when you urinate.  You have a change in your bowel movements.  You have increasing redness, pain, or swelling near your incision.  You have pus draining from your incision.  Your incision is  separating.  You have painful, hard, or reddened breasts.  You have a severe headache.  You have blurred vision or see spots.  You feel sad or depressed.  You have thoughts of hurting yourself or your newborn.  You have questions about your care, the care of your newborn, or medicines.  You are dizzy or lightheaded.  You have a rash.  You have pain, redness, or swelling at the site of the removed intravenous access (IV) tube.  You have nausea or vomiting.  You stopped breastfeeding and have not had a menstrual period within 12 weeks of stopping.  You are not breastfeeding and have not had a menstrual period within 12 weeks of delivery.  You have a fever. SEEK IMMEDIATE MEDICAL CARE IF:  You have persistent pain.  You have chest pain.  You have shortness of breath.  You faint.  You have leg pain.  You have stomach pain.  Your vaginal bleeding saturates 2 or more sanitary pads in 1 hour. MAKE SURE YOU:   Understand these instructions.  Will watch your condition.  Will  get help right away if you are not doing well or get worse. Document Released: 07/19/2002 Document Revised: 07/21/2012 Document Reviewed: 06/23/2012 [facility] Patient Information 2014 [place].   Postpartum Depression and Baby Blues  The postpartum period begins right after the birth of a baby. During this time, there is often a great amount of joy and excitement. It is also a time of considerable changes in the life of the parent(s). Regardless of how many times a mother gives birth, each child brings new challenges and dynamics to the family. It is not unusual to have feelings of excitement accompanied by confusing shifts in moods, emotions, and thoughts. All mothers are at risk of developing postpartum depression or the "baby blues." These mood changes can occur right after giving birth, or they may occur many months after giving birth. The baby blues or postpartum depression can be mild or  severe. Additionally, postpartum depression can resolve rather quickly, or it can be a long-term condition. CAUSES Elevated hormones and their rapid decline are thought to be a main cause of postpartum depression and the baby blues. There are a number of hormones that radically change during and after pregnancy. Estrogen and progesterone usually decrease immediately after delivering your baby. The level of thyroid hormone and various cortisol steroids also rapidly drop. Other factors that play a major role in these changes include major life events and genetics.  RISK FACTORS If you have any of the following risks for the baby blues or postpartum depression, know what symptoms to watch out for during the postpartum period. Risk factors that may increase the likelihood of getting the baby blues or postpartum depression include: 1. Havinga personal or family history of depression. 2. Having depression while being pregnant. 3. Having premenstrual or oral contraceptive-associated mood issues. 4. Having exceptional life stress. 5. Having marital conflict. 6. Lacking a social support network. 7. Having a baby with special needs. 8. Having health problems such as diabetes. SYMPTOMS Baby blues symptoms include:  Brief fluctuations in mood, such as going from extreme happiness to sadness.  Decreased concentration.  Difficulty sleeping.  Crying spells, tearfulness.  Irritability.  Anxiety. Postpartum depression symptoms typically begin within the first month after giving birth. These symptoms include:  Difficulty sleeping or excessive sleepiness.  Marked weight loss.  Agitation.  Feelings of worthlessness.  Lack of interest in activity or food. Postpartum psychosis is a very concerning condition and can be dangerous. Fortunately, it is rare. Displaying any of the following symptoms is cause for immediate medical attention. Postpartum psychosis symptoms include:  Hallucinations and  delusions.  Bizarre or disorganized behavior.  Confusion or disorientation. DIAGNOSIS  A diagnosis is made by an evaluation of your symptoms. There are no medical or lab tests that lead to a diagnosis, but there are various questionnaires that a caregiver may use to identify those with the baby blues, postpartum depression, or psychosis. Often times, a screening tool called the [place] Postnatal Depression Scale is used to diagnose depression in the postpartum period.  TREATMENT The baby blues usually goes away on its own in 1 to 2 weeks. Social support is often all that is needed. You should be encouraged to get adequate sleep and rest. Occasionally, you may be given medicines to help you sleep.  Postpartum depression requires treatment as it can last several months or longer if it is not treated. Treatment may include individual or group therapy, medicine, or both to address any social, physiological, and psychological factors that may play  a role in the depression. Regular exercise, a healthy diet, rest, and social support may also be strongly recommended.  Postpartum psychosis is more serious and needs treatment right away. Hospitalization is often needed. HOME CARE INSTRUCTIONS  Get as much rest as you can. Nap when the baby sleeps.  Exercise regularly. Some women find yoga and walking to be beneficial.  Eat a balanced and nourishing diet.  Do little things that you enjoy. Have a cup of tea, take a bubble bath, read your favorite magazine, or listen to your favorite music.  Avoid alcohol.  Ask for help with household chores, cooking, grocery shopping, or running errands as needed. Do not try to do everything.  Talk to people close to you about how you are feeling. Get support from your partner, family members, friends, or other new moms.  Try to stay positive in how you think. Think about the things you are grateful for.  Do not spend a lot of time alone.  Only take medicine as  directed by your caregiver.  Keep all your postpartum appointments.  Let your caregiver know if you have any concerns. SEEK MEDICAL CARE IF: You are having a reaction or problems with your medicine. SEEK IMMEDIATE MEDICAL CARE IF:  You have suicidal feelings.  You feel you may harm the baby or someone else. Document Released: 07/31/2004 Document Revised: 01/19/2012 Document Reviewed: 09/02/2011 [facility] Patient Information 2014 [place], [place].   Breastfeeding Deciding to breastfeed is one of the best choices you can make for you and your baby. A change in hormones during pregnancy causes your breast tissue to grow and increases the number and size of your milk ducts. These hormones also allow proteins, sugars, and fats from your blood supply to make breast milk in your milk-producing glands. Hormones prevent breast milk from being released before your baby is born as well as prompt milk flow after birth. Once breastfeeding has begun, thoughts of your baby, as well as his or her sucking or crying, can stimulate the release of milk from your milk-producing glands.  BENEFITS OF BREASTFEEDING For Your Baby  Your first milk (colostrum) helps your baby's digestive system function better.   There are antibodies in your milk that help your baby fight off infections.   Your baby has a lower incidence of asthma, allergies, and sudden infant death syndrome.   The nutrients in breast milk are better for your baby than infant formulas and are designed uniquely for your baby's needs.   Breast milk improves your baby's brain development.   Your baby is less likely to develop other conditions, such as childhood obesity, asthma, or type 2 diabetes mellitus.  For You   Breastfeeding helps to create a very special bond between you and your baby.   Breastfeeding is convenient. Breast milk is always available at the correct temperature and costs nothing.   Breastfeeding helps to burn  calories and helps you lose the weight gained during pregnancy.   Breastfeeding makes your uterus contract to its prepregnancy size faster and slows bleeding (lochia) after you give birth.   Breastfeeding helps to lower your risk of developing type 2 diabetes mellitus, osteoporosis, and breast or ovarian cancer later in life. SIGNS THAT YOUR BABY IS HUNGRY Early Signs of Hunger  Increased alertness or activity.  Stretching.  Movement of the head from side to side.  Movement of the head and opening of the mouth when the corner of the mouth or cheek is stroked (rooting).  Increased sucking sounds, smacking lips, cooing, sighing, or squeaking.  Hand-to-mouth movements.  Increased sucking of fingers or hands. Late Signs of Hunger  Fussing.  Intermittent crying. Extreme Signs of Hunger Signs of extreme hunger will require calming and consoling before your baby will be able to breastfeed successfully. Do not wait for the following signs of extreme hunger to occur before you initiate breastfeeding:   Restlessness.  A loud, strong cry.   Screaming.  BREASTFEEDING BASICS Breastfeeding Initiation  Find a comfortable place to sit or lie down, with your neck and back well supported.  Place a pillow or rolled up blanket under your baby to bring him or her to the level of your breast (if you are seated). Nursing pillows are specially designed to help support your arms and your baby while you breastfeed.  Make sure that your baby's abdomen is facing your abdomen.   Gently massage your breast. With your fingertips, massage from your chest wall toward your nipple in a circular motion. This encourages milk flow. You may need to continue this action during the feeding if your milk flows slowly.  Support your breast with 4 fingers underneath and your thumb above your nipple. Make sure your fingers are well away from your nipple and your baby's mouth.   Stroke your baby's lips gently  with your finger or nipple.   When your baby's mouth is open wide enough, quickly bring your baby to your breast, placing your entire nipple and as much of the colored area around your nipple (areola) as possible into your baby's mouth.   More areola should be visible above your baby's upper lip than below the lower lip.   Your baby's tongue should be between his or her lower gum and your breast.   Ensure that your baby's mouth is correctly positioned around your nipple (latched). Your baby's lips should create a seal on your breast and be turned out (everted).  It is common for your baby to suck about 2-3 minutes in order to start the flow of breast milk. Latching Teaching your baby how to latch on to your breast properly is very important. An improper latch can cause nipple pain and decreased milk supply for you and poor weight gain in your baby. Also, if your baby is not latched onto your nipple properly, he or she may swallow some air during feeding. This can make your baby fussy. Burping your baby when you switch breasts during the feeding can help to get rid of the air. However, teaching your baby to latch on properly is still the best way to prevent fussiness from swallowing air while breastfeeding. Signs that your baby has successfully latched on to your nipple:    Silent tugging or silent sucking, without causing you pain.   Swallowing heard between every 3-4 sucks.    Muscle movement above and in front of his or her ears while sucking.  Signs that your baby has not successfully latched on to nipple:   Sucking sounds or smacking sounds from your baby while breastfeeding.  Nipple pain. If you think your baby has not latched on correctly, slip your finger into the corner of your baby's mouth to break the suction and place it between your baby's gums. Attempt breastfeeding initiation again. Signs of Successful Breastfeeding Signs from your baby:   A gradual decrease in the  number of sucks or complete cessation of sucking.   Falling asleep.   Relaxation of his or her body.  Retention of a small amount of milk in his or her mouth.   Letting go of your breast by himself or herself. Signs from you:  Breasts that have increased in firmness, weight, and size 1-3 hours after feeding.   Breasts that are softer immediately after breastfeeding.  Increased milk volume, as well as a change in milk consistency and color by the fifth day of breastfeeding.   Nipples that are not sore, cracked, or bleeding. Signs That Your [employer]el Books is Getting Enough Milk  Wetting at least 3 diapers in a 24-hour period. The urine should be clear and pale yellow by age 509 days.  At least 3 stools in a 24-hour period by age 509 days. The stool should be soft and yellow.  At least 3 stools in a 24-hour period by age 76 days. The stool should be seedy and yellow.  No loss of weight greater than 10% of birth weight during the first 1 days of age.  Average weight gain of 4-7 ounces (113-198 g) per week after age 79 days.  Consistent daily weight gain by age 792 days, without weight loss after the age of 2 weeks. After a feeding, your baby may spit up a small amount. This is common. BREASTFEEDING FREQUENCY AND DURATION Frequent feeding will help you make more milk and can prevent sore nipples and breast engorgement. Breastfeed when you feel the need to reduce the fullness of your breasts or when your baby shows signs of hunger. This is called "breastfeeding on demand." Avoid introducing a pacifier to your baby while you are working to establish breastfeeding (the first 4-6 weeks after your baby is born). After this time you may choose to use a pacifier. Research has shown that pacifier use during the first year of a baby's life decreases the risk of sudden infant death syndrome (SIDS). Allow your baby to feed on each breast as long as he or she wants. Breastfeed until your baby is finished  feeding. When your baby unlatches or falls asleep while feeding from the first breast, offer the second breast. Because newborns are often sleepy in the first few weeks of life, you may need to awaken your baby to get him or her to feed. Breastfeeding times will vary from baby to baby. However, the following rules can serve as a guide to help you ensure that your baby is properly fed:  Newborns (babies 110 weeks of age or younger) may breastfeed every 1-3 hours.  Newborns should not go longer than 3 hours during the day or 5 hours during the night without breastfeeding.  You should breastfeed your baby a minimum of 8 times in a 24-hour period until you begin to introduce solid foods to your baby at around 60 months of age. BREAST MILK PUMPING Pumping and storing breast milk allows you to ensure that your baby is exclusively fed your breast milk, even at times when you are unable to breastfeed. This is especially important if you are going back to work while you are still breastfeeding or when you are not able to be present during feedings. Your lactation consultant can give you guidelines on how long it is safe to store breast milk.  A breast pump is a machine that allows you to pump milk from your breast into a sterile bottle. The pumped breast milk can then be stored in a refrigerator or freezer. Some breast pumps are operated by hand, while others use electricity. Ask your lactation consultant which type will  work best for you. Breast pumps can be purchased, but some hospitals and breastfeeding support groups lease breast pumps on a monthly basis. A lactation consultant can teach you how to hand express breast milk, if you prefer not to use a pump.  CARING FOR YOUR BREASTS WHILE YOU BREASTFEED Nipples can become dry, cracked, and sore while breastfeeding. The following recommendations can help keep your breasts moisturized and healthy:  Avoid using soap on your nipples.   Wear a supportive bra.  Although not required, special nursing bras and tank tops are designed to allow access to your breasts for breastfeeding without taking off your entire bra or top. Avoid wearing underwire-style bras or extremely tight bras.  Air dry your nipples for 3-58mnutes after each feeding.   Use only cotton bra pads to absorb leaked breast milk. Leaking of breast milk between feedings is normal.   Use lanolin on your nipples after breastfeeding. Lanolin helps to maintain your skin's normal moisture barrier. If you use pure lanolin, you do not need to wash it off before feeding your baby again. Pure lanolin is not toxic to your baby. You may also hand express a few drops of breast milk and gently massage that milk into your nipples and allow the milk to air dry. In the first few weeks after giving birth, some women experience extremely full breasts (engorgement). Engorgement can make your breasts feel heavy, warm, and tender to the touch. Engorgement peaks within 3-5 days after you give birth. The following recommendations can help ease engorgement:  Completely empty your breasts while breastfeeding or pumping. You may want to start by applying warm, moist heat (in the shower or with warm water-soaked hand towels) just before feeding or pumping. This increases circulation and helps the milk flow. If your baby does not completely empty your breasts while breastfeeding, pump any extra milk after he or she is finished.  Wear a snug bra (nursing or regular) or tank top for 1-2 days to signal your body to slightly decrease milk production.  Apply ice packs to your breasts, unless this is too uncomfortable for you.  Make sure that your baby is latched on and positioned properly while breastfeeding. If engorgement persists after 48 hours of following these recommendations, contact your health care provider or a lScience writer OVERALL HEALTH CARE RECOMMENDATIONS WHILE BREASTFEEDING  Eat healthy foods.  Alternate between meals and snacks, eating 3 of each per day. Because what you eat affects your breast milk, some of the foods may make your baby more irritable than usual. Avoid eating these foods if you are sure that they are negatively affecting your baby.  Drink milk, fruit juice, and water to satisfy your thirst (about 10 glasses a day).   Rest often, relax, and continue to take your prenatal vitamins to prevent fatigue, stress, and anemia.  Continue breast self-awareness checks.  Avoid chewing and smoking tobacco.  Avoid alcohol and drug use. Some medicines that may be harmful to your baby can pass through breast milk. It is important to ask your health care provider before taking any medicine, including all over-the-counter and prescription medicine as well as vitamin and herbal supplements. It is possible to become pregnant while breastfeeding. If birth control is desired, ask your health care provider about options that will be safe for your baby. SEEK MEDICAL CARE IF:   You feel like you want to stop breastfeeding or have become frustrated with breastfeeding.  You have painful breasts or nipples.  Your  nipples are cracked or bleeding.  Your breasts are red, tender, or warm.  You have a swollen area on either breast.  You have a fever or chills.  You have nausea or vomiting.  You have drainage other than breast milk from your nipples.  Your breasts do not become full before feedings by the fifth day after you give birth.  You feel sad and depressed.  Your baby is too sleepy to eat well.  Your baby is having trouble sleeping.   Your baby is wetting less than 3 diapers in a 24-hour period.  Your baby has less than 3 stools in a 24-hour period.  Your baby's skin or the white part of his or her eyes becomes yellow.   Your baby is not gaining weight by 7 days of age. SEEK IMMEDIATE MEDICAL CARE IF:   Your baby is overly tired (lethargic) and does not want to  wake up and feed.  Your baby develops an unexplained fever. Document Released: 10/27/2005 Document Revised: 11/01/2013 Document Reviewed: 04/20/2013 [facility] Patient Information 2015 [place], [place]. This information is not intended to replace advice given to you by your health care provider. Make sure you discuss any questions you have with your health care provider.

## 2015-05-25 NOTE — Progress Notes (Signed)
[name] [id number]  Subjective: Postpartum Day 2: Primary LTC/S due to pre-eclampsia with severe features, FTP Patient up ad lib, reports no syncope or dizziness.  Denies HA, visual sx, or epigastric pain. Feeding:  Breast and bottle Contraceptive plan:  Undecided  Baby under bili blanket in room.  Objective: Temp:  [98.6 F (37 C)-99.8 F (37.7 C)] 98.6 F (37 C) (07/15 0400) Pulse Rate:  [88-106] 92 (07/15 0400) Resp:  [16-20] 20 (07/15 0400) BP: (100-164)/(62-90) 138/79 mmHg (07/15 0400) SpO2:  [94 %-100 %] 98 % (07/15 0400)  Filed Vitals:   05/24/15 1800 05/24/15 1945 05/25/15 0007 05/25/15 0400  BP: 152/86 146/90 164/75 138/79  Pulse: 95 94 96 92  Temp:  98.7 F (37.1 C) 99.8 F (37.7 C) 98.6 F (37 C)  TempSrc:  Oral Oral Oral  Resp: 18 18 20 20   Height:      Weight:      SpO2: 98% 98% 98% 98%     Recent Labs  05/24/15 0519 05/24/15 1659 05/25/15 0525  HGB 9.2* 8.9* 8.2*  HCT 28.2* 28.2* 25.5*  WBC 20.4* 20.6* 19.7*   CMP Latest Ref Rng 05/25/2015 05/24/2015 05/24/2015  Glucose 65 - 99 mg/dL 78 100(H) 93  BUN 6 - 20 mg/dL 27(H) 23(H) 19  Creatinine 0.44 - 1.00 mg/dL 1.42(H) 1.66(H) 1.75(H)  Sodium 135 - 145 mmol/L 135 135 131(L)  Potassium 3.5 - 5.1 mmol/L 4.5 4.3 4.6  Chloride 101 - 111 mmol/L 110 110 106  CO2 22 - 32 mmol/L 21(L) 22 21(L)  Calcium 8.9 - 10.3 mg/dL 7.6(L) 7.6(L) 7.4(L)  Total Protein 6.5 - 8.1 g/dL 4.9(L) 5.6(L) 5.4(L)  Total Bilirubin 0.3 - 1.2 mg/dL 0.2(L) 0.3 0.3  Alkaline Phos 38 - 126 U/L 145(H) 161(H) 173(H)  AST 15 - 41 U/L 33 30 26  ALT 14 - 54 U/L 15 14 13(L)    Physical Exam:  General: alert Lochia: appropriate Uterine Fundus: firm Abdomen:  + bowel sounds Incision: Honeycomb dressing CDI DVT Evaluation: No evidence of DVT seen on physical exam. Negative Homan's sign. DTR 1+, no clonus, trace edema  Assessment/Plan: Status post cesarean delivery, day 2 Pre-eclampsia with severe features Elevated serum  creatnine--improving Elevated BUN Anemia without hemodynamic instability Adequate urine output. Stable Continue current care. Repeat CBC, CMP tomorrow Plan for discharge tomorrow    Donnel Saxon MSN, CNM 05/25/2015, 8:05 AM

## 2015-05-26 MED ORDER — IBUPROFEN 600 MG PO TABS: 600.0000 mg | ORAL_TABLET | Freq: Four times a day (QID) | ORAL | Status: DC

## 2015-05-26 MED ORDER — OXYCODONE-ACETAMINOPHEN 5-325 MG PO TABS: 1.0000 | ORAL_TABLET | ORAL | Status: DC | PRN

## 2015-05-26 MED ORDER — FERROUS SULFATE 325 (65 FE) MG PO TABS: 325.0000 mg | ORAL_TABLET | Freq: Two times a day (BID) | ORAL | Status: AC

## 2015-05-26 NOTE — Discharge Instructions (Signed)
Iron-Rich Diet An iron-rich diet contains foods that are good sources of iron. Iron is an important mineral that helps your body produce hemoglobin. Hemoglobin is a protein in red blood cells that carries oxygen to the body's tissues. Sometimes, the iron level in your blood can be low. This may be caused by:  A lack of iron in your diet.  Blood loss.  Times of growth, such as during pregnancy or during a child's growth and development. Low levels of iron can cause a decrease in the number of red blood cells. This can result in iron deficiency anemia. Iron deficiency anemia symptoms include:  Tiredness.  Weakness.  Irritability.  Increased chance of infection. Here are some recommendations for daily iron intake:  Males older than 40 years of age need 8 mg of iron per day.  Women ages 19 to 50 need 18 mg of iron per day.  Pregnant women need 27 mg of iron per day, and women who are over 19 years of age and breastfeeding need 9 mg of iron per day.  Women over the age of 50 need 8 mg of iron per day. SOURCES OF IRON There are 2 types of iron that are found in food: heme iron and nonheme iron. Heme iron is absorbed by the body better than nonheme iron. Heme iron is found in meat, poultry, and fish. Nonheme iron is found in grains, beans, and vegetables. Heme Iron Sources Food / Iron (mg)  Chicken liver, 3 oz (85 g)/ 10 mg  Beef liver, 3 oz (85 g)/ 5.5 mg  Oysters, 3 oz (85 g)/ 8 mg  Beef, 3 oz (85 g)/ 2 to 3 mg  Shrimp, 3 oz (85 g)/ 2.8 mg  Turkey, 3 oz (85 g)/ 2 mg  Chicken, 3 oz (85 g) / 1 mg  Fish (tuna, halibut), 3 oz (85 g)/ 1 mg  Pork, 3 oz (85 g)/ 0.9 mg Nonheme Iron Sources Food / Iron (mg)  Ready-to-eat breakfast cereal, iron-fortified / 3.9 to 7 mg  Tofu,  cup / 3.4 mg  Kidney beans,  cup / 2.6 mg  Baked potato with skin / 2.7 mg  Asparagus,  cup / 2.2 mg  Avocado / 2 mg  Dried peaches,  cup / 1.6 mg  Raisins,  cup / 1.5 mg  Soy milk, 1 cup  / 1.5 mg  Whole-wheat bread, 1 slice / 1.2 mg  Spinach, 1 cup / 0.8 mg  Broccoli,  cup / 0.6 mg IRON ABSORPTION Certain foods can decrease the body's absorption of iron. Try to avoid these foods and beverages while eating meals with iron-containing foods:  Coffee.  Tea.  Fiber.  Soy. Foods containing vitamin C can help increase the amount of iron your body absorbs from iron sources, especially from nonheme sources. Eat foods with vitamin C along with iron-containing foods to increase your iron absorption. Foods that are high in vitamin C include many fruits and vegetables. Some good sources are:  Fresh orange juice.  Oranges.  Strawberries.  Mangoes.  Grapefruit.  Red bell peppers.  Green bell peppers.  Broccoli.  Potatoes with skin.  Tomato juice. Document Released: 06/10/2005 Document Revised: 01/19/2012 Document Reviewed: 04/17/2011 [employer] Patient Information 2015 [employer]. This information is not intended to replace advice given to you by your health care provider. Make sure you discuss any questions you have with your health care provider.  

## 2015-06-01 ENCOUNTER — Inpatient Hospital Stay (HOSPITAL_COMMUNITY): Admission: RE | Admit: 2015-06-01 | Payer: Managed Care, Other (non HMO) | Source: Ambulatory Visit

## 2018-02-05 ENCOUNTER — Other Ambulatory Visit: Payer: Self-pay | Admitting: Internal Medicine

## 2018-02-05 DIAGNOSIS — Z1231 Encounter for screening mammogram for malignant neoplasm of breast: Secondary | ICD-10-CM

## 2018-03-08 ENCOUNTER — Ambulatory Visit
Admission: RE | Admit: 2018-03-08 | Discharge: 2018-03-08 | Disposition: A | Discharge status: Home / Self Care | Payer: 59 | Source: Ambulatory Visit | Attending: Internal Medicine | Admitting: Internal Medicine

## 2018-03-08 DIAGNOSIS — Z1231 Encounter for screening mammogram for malignant neoplasm of breast: Secondary | ICD-10-CM

## 2018-03-09 ENCOUNTER — Other Ambulatory Visit: Payer: Self-pay | Admitting: Internal Medicine

## 2018-03-09 DIAGNOSIS — R928 Other abnormal and inconclusive findings on diagnostic imaging of breast: Secondary | ICD-10-CM

## 2018-03-12 ENCOUNTER — Other Ambulatory Visit: Payer: Self-pay | Admitting: Internal Medicine

## 2018-03-12 ENCOUNTER — Ambulatory Visit
Admission: RE | Admit: 2018-03-12 | Discharge: 2018-03-12 | Disposition: A | Discharge status: Home / Self Care | Payer: 59 | Source: Ambulatory Visit | Attending: Internal Medicine | Admitting: Internal Medicine

## 2018-03-12 DIAGNOSIS — R928 Other abnormal and inconclusive findings on diagnostic imaging of breast: Secondary | ICD-10-CM

## 2018-03-12 DIAGNOSIS — N6310 Unspecified lump in the right breast, unspecified quadrant: Secondary | ICD-10-CM

## 2018-03-19 ENCOUNTER — Ambulatory Visit
Admission: RE | Admit: 2018-03-19 | Discharge: 2018-03-19 | Disposition: A | Discharge status: Home / Self Care | Payer: 59 | Source: Ambulatory Visit | Attending: Internal Medicine | Admitting: Internal Medicine

## 2018-03-19 DIAGNOSIS — N6310 Unspecified lump in the right breast, unspecified quadrant: Secondary | ICD-10-CM

## 2018-08-09 ENCOUNTER — Other Ambulatory Visit: Payer: Self-pay | Admitting: Internal Medicine

## 2018-08-09 DIAGNOSIS — Z6827 Body mass index (BMI) 27.0-27.9, adult: Secondary | ICD-10-CM

## 2018-08-09 DIAGNOSIS — Z79899 Other long term (current) drug therapy: Secondary | ICD-10-CM | POA: Diagnosis not present

## 2018-08-09 DIAGNOSIS — D649 Anemia, unspecified: Secondary | ICD-10-CM | POA: Diagnosis not present

## 2018-08-09 DIAGNOSIS — I10 Essential (primary) hypertension: Secondary | ICD-10-CM | POA: Diagnosis not present

## 2018-08-10 LAB — IRON AND TIBC
IRON: 22 ug/dL — AB (ref 27–159)
Iron Saturation: 5 % — CL (ref 15–55)
Total Iron Binding Capacity: 417 ug/dL (ref 250–450)
UIBC: 395 ug/dL (ref 131–425)

## 2018-08-10 LAB — COMPREHENSIVE METABOLIC PANEL
ALT: 8 IU/L (ref 0–32)
AST: 16 IU/L (ref 0–40)
Albumin/Globulin Ratio: 1.3 (ref 1.2–2.2)
Albumin: 4 g/dL (ref 3.5–5.5)
Alkaline Phosphatase: 63 IU/L (ref 39–117)
BILIRUBIN TOTAL: 0.4 mg/dL (ref 0.0–1.2)
BUN/Creatinine Ratio: 14 (ref 9–23)
BUN: 13 mg/dL (ref 6–24)
CO2: 25 mmol/L (ref 20–29)
Calcium: 9.5 mg/dL (ref 8.7–10.2)
Chloride: 104 mmol/L (ref 96–106)
Creatinine, Ser: 0.92 mg/dL (ref 0.57–1.00)
GFR calc Af Amer: 89 mL/min/{1.73_m2} (ref >59)
GFR calc non Af Amer: 77 mL/min/{1.73_m2} (ref >59)
GLUCOSE: 86 mg/dL (ref 65–99)
Globulin, Total: 3.1 g/dL (ref 1.5–4.5)
Potassium: 4.8 mmol/L (ref 3.5–5.2)
Sodium: 144 mmol/L (ref 134–144)
Total Protein: 7.1 g/dL (ref 6.0–8.5)

## 2018-08-10 LAB — CBC
HEMATOCRIT: 35.7 % (ref 34.0–46.6)
Hemoglobin: 10.9 g/dL — ABNORMAL LOW (ref 11.1–15.9)
MCH: 23.2 pg — ABNORMAL LOW (ref 26.6–33.0)
MCHC: 30.5 g/dL — ABNORMAL LOW (ref 31.5–35.7)
MCV: 76 fL — ABNORMAL LOW (ref 79–97)
Platelets: 376 10*3/uL (ref 150–450)
RBC: 4.7 x10E6/uL (ref 3.77–5.28)
RDW: 17.4 % — AB (ref 12.3–15.4)
WBC: 8.7 10*3/uL (ref 3.4–10.8)

## 2018-08-10 LAB — FERRITIN: Ferritin: 6 ng/mL — ABNORMAL LOW (ref 15–150)

## 2018-08-16 NOTE — Progress Notes (Signed)
Results given to patient in [place].

## 2019-03-03 ENCOUNTER — Encounter: Payer: Self-pay | Admitting: Internal Medicine

## 2019-03-28 ENCOUNTER — Encounter: Payer: Self-pay | Admitting: Internal Medicine

## 2019-03-28 ENCOUNTER — Ambulatory Visit (INDEPENDENT_AMBULATORY_CARE_PROVIDER_SITE_OTHER): Payer: 59 | Admitting: Internal Medicine

## 2019-03-28 ENCOUNTER — Other Ambulatory Visit: Payer: Self-pay

## 2019-03-28 VITALS — BP 122/86 | HR 81 | Temp 98.0°F | Ht 64.2 in | Wt 167.4 lb

## 2019-03-28 DIAGNOSIS — Z0000 Encounter for general adult medical examination without abnormal findings: Secondary | ICD-10-CM

## 2019-03-28 DIAGNOSIS — N390 Urinary tract infection, site not specified: Secondary | ICD-10-CM

## 2019-03-28 LAB — POCT URINALYSIS DIPSTICK
Bilirubin, UA: NEGATIVE
Glucose, UA: NEGATIVE
Ketones, UA: NEGATIVE
Nitrite, UA: POSITIVE
Protein, UA: NEGATIVE
Spec Grav, UA: 1.02 (ref 1.010–1.025)
Urobilinogen, UA: 0.2 E.U./dL
pH, UA: 7.5 (ref 5.0–8.0)

## 2019-03-28 NOTE — Patient Instructions (Signed)

## 2019-03-28 NOTE — Progress Notes (Signed)
Subjective:     Patient ID: [name] , female    DOB: 1975-11-14 , 43 y.o.   MRN: [id number]   Chief Complaint  Patient presents with  . Annual Exam    HPI  She is here today for a full physical examination.  She is followed by Dr. Raphael Gibney for her pap smears. She was not aware that he retired Jan 2020. Her last visit/pap was February 27, 2018. She has no specific concerns or complaints at this time.     Past Medical History:  Diagnosis Date  . Hx of colostomy      Family History  Problem Relation Age of Onset  . Hyperlipidemia Mother   . Cancer Maternal Grandmother   . Hypertension Maternal Grandmother   . Hypertension Maternal Grandfather   . Heart disease Maternal Grandfather   . Stroke Paternal Grandfather   . Hypertension Paternal Grandfather   . Heart attack Paternal Grandfather   . Hypertension Father   . Diabetes Father   . Hypertension Paternal Grandmother      Current Outpatient Medications:  .  ferrous sulfate 325 (65 FE) MG tablet, Take 1 tablet (325 mg total) by mouth 2 (two) times daily with a meal., Disp: 60 tablet, Rfl: 3 .  VITAMIN D PO, Take by mouth., Disp: , Rfl:    No Known Allergies    The patient states she uses nothing for birth control. Last LMP was Patient's last menstrual period was 03/16/2019.. Negative for Dysmenorrhea _0 @. Negative for: breast discharge, breast lump(s), breast pain and breast self exam. Associated symptoms include abnormal vaginal bleeding. Pertinent negatives include abnormal bleeding (hematology), anxiety, decreased libido, depression, difficulty falling sleep, dyspareunia, history of infertility, nocturia, sexual dysfunction, sleep disturbances, urinary incontinence, urinary urgency, vaginal discharge and vaginal itching. Diet regular.The patient states her exercise level is    . The patient's tobacco use is:  Social History   Tobacco Use  Smoking Status Former Smoker  . Years: 7.00  . Types:  Cigarettes  Smokeless Tobacco Never Used  Tobacco Comment   1 -2 only when going out  . She has been exposed to passive smoke. The patient's alcohol use is:  Social History   Substance and Sexual Activity  Alcohol Use No  . Additional information: Last pap April 2019, next one scheduled for April 2021.   Review of Systems  Constitutional: Negative.   HENT: Negative.   Eyes: Negative.   Respiratory: Negative.   Cardiovascular: Negative.   Gastrointestinal: Negative.   Endocrine: Negative.   Genitourinary: Negative.   Musculoskeletal: Negative.   Skin: Negative.   Allergic/Immunologic: Negative.   Neurological: Negative.   Hematological: Negative.   Psychiatric/Behavioral: Negative.      Today's Vitals   03/28/19 0956  BP: 122/86  Pulse: 81  Temp: 98 F (36.7 C)  TempSrc: Oral  Weight: 167 lb 6.4 oz (75.9 kg)  Height: 5' 4.2" (1.631 m)   Body mass index is 28.56 kg/m.   Objective:  Physical Exam Vitals signs and nursing note reviewed.  Constitutional:      Appearance: Normal appearance.  HENT:     Head: Normocephalic and atraumatic.     Right Ear: Tympanic membrane, ear canal and external ear normal.     Left Ear: Tympanic membrane, ear canal and external ear normal.     Nose: Nose normal.     Mouth/Throat:     Mouth: Mucous membranes are moist.     Pharynx: Oropharynx is clear.  Eyes:     Extraocular Movements: Extraocular movements intact.     Conjunctiva/sclera: Conjunctivae normal.     Pupils: Pupils are equal, round, and reactive to light.  Neck:     Musculoskeletal: Normal range of motion and neck supple.  Cardiovascular:     Rate and Rhythm: Normal rate and regular rhythm.     Pulses: Normal pulses.     Heart sounds: Normal heart sounds.  Pulmonary:     Effort: Pulmonary effort is normal.     Breath sounds: Normal breath sounds.  Chest:     Breasts:        Right: Normal. No swelling, bleeding, inverted nipple, mass or nipple discharge.         Left: Normal. No swelling, bleeding, inverted nipple, mass or nipple discharge.  Abdominal:     General: Abdomen is flat. Bowel sounds are normal.     Palpations: Abdomen is soft.  Genitourinary:    Comments: deferred Musculoskeletal: Normal range of motion.  Skin:    General: Skin is warm and dry.          Comments: Healed surgical scar LLQ, scattered healed scars on anterior thorax  Neurological:     General: No focal deficit present.     Mental Status: She is alert and oriented to person, place, and time.  Psychiatric:        Mood and Affect: Mood normal.        Behavior: Behavior normal.         Assessment And Plan:      1. Routine general medical examination at health care facility  A full exam was performed.  Importance of monthly self breast exams was discussed with the patient. PATIENT HAS BEEN ADVISED TO GET 30-45 MINUTES REGULAR EXERCISE NO LESS THAN FOUR TO FIVE DAYS PER WEEK - BOTH WEIGHTBEARING EXERCISES AND AEROBIC ARE RECOMMENDED.  SHE IS ADVISED TO FOLLOW A HEALTHY DIET WITH AT LEAST SIX FRUITS/VEGGIES PER DAY, DECREASE INTAKE OF RED MEAT, AND TO INCREASE FISH INTAKE TO TWO DAYS PER WEEK.  MEATS/FISH SHOULD NOT BE FRIED, BAKED OR BROILED IS PREFERABLE.  I SUGGEST WEARING SPF 50 SUNSCREEN ON EXPOSED PARTS AND ESPECIALLY WHEN IN THE DIRECT SUNLIGHT FOR AN EXTENDED PERIOD OF TIME.  PLEASE AVOID FAST FOOD RESTAURANTS AND INCREASE YOUR WATER INTAKE.  - CMP14+EGFR - CBC - Lipid panel - Hemoglobin A1c - POCT Urinalysis Dipstick (81002)  2. Urinary tract infection without hematuria, site unspecified  She has recurrent UTIs. I will check urine culture to ensure no resistance.   - Culture, Urine   Maximino Greenland, MD    THE PATIENT IS ENCOURAGED TO PRACTICE SOCIAL DISTANCING DUE TO THE COVID-19 PANDEMIC.

## 2019-03-29 LAB — CMP14+EGFR
ALT: 10 IU/L (ref 0–32)
AST: 14 IU/L (ref 0–40)
Albumin/Globulin Ratio: 1.5 (ref 1.2–2.2)
Albumin: 4.1 g/dL (ref 3.8–4.8)
Alkaline Phosphatase: 69 IU/L (ref 39–117)
BUN/Creatinine Ratio: 14 (ref 9–23)
BUN: 13 mg/dL (ref 6–24)
Bilirubin Total: 0.5 mg/dL (ref 0.0–1.2)
CO2: 23 mmol/L (ref 20–29)
Calcium: 9 mg/dL (ref 8.7–10.2)
Chloride: 100 mmol/L (ref 96–106)
Creatinine, Ser: 0.95 mg/dL (ref 0.57–1.00)
GFR calc Af Amer: 85 mL/min/{1.73_m2} (ref >59)
GFR calc non Af Amer: 74 mL/min/{1.73_m2} (ref >59)
Globulin, Total: 2.7 g/dL (ref 1.5–4.5)
Glucose: 78 mg/dL (ref 65–99)
Potassium: 4.2 mmol/L (ref 3.5–5.2)
Sodium: 136 mmol/L (ref 134–144)
Total Protein: 6.8 g/dL (ref 6.0–8.5)

## 2019-03-29 LAB — HEMOGLOBIN A1C
Est. average glucose Bld gHb Est-mCnc: 91 mg/dL
Hgb A1c MFr Bld: 4.8 % (ref 4.8–5.6)

## 2019-03-29 LAB — LIPID PANEL
Chol/HDL Ratio: 2.9 ratio (ref 0.0–4.4)
Cholesterol, Total: 161 mg/dL (ref 100–199)
HDL: 56 mg/dL (ref >39)
LDL Calculated: 93 mg/dL (ref 0–99)
Triglycerides: 59 mg/dL (ref 0–149)
VLDL Cholesterol Cal: 12 mg/dL (ref 5–40)

## 2019-03-29 LAB — CBC
Hematocrit: 44.7 % (ref 34.0–46.6)
Hemoglobin: 14.6 g/dL (ref 11.1–15.9)
MCH: 31.5 pg (ref 26.6–33.0)
MCHC: 32.7 g/dL (ref 31.5–35.7)
MCV: 97 fL (ref 79–97)
Platelets: 241 10*3/uL (ref 150–450)
RBC: 4.63 x10E6/uL (ref 3.77–5.28)
RDW: 11.6 % — ABNORMAL LOW (ref 11.7–15.4)
WBC: 8.6 10*3/uL (ref 3.4–10.8)

## 2019-03-30 LAB — URINE CULTURE

## 2019-03-31 ENCOUNTER — Other Ambulatory Visit: Payer: Self-pay | Admitting: Internal Medicine

## 2019-03-31 MED ORDER — NITROFURANTOIN MONOHYD MACRO 100 MG PO CAPS: 100.0000 mg | ORAL_CAPSULE | Freq: Two times a day (BID) | ORAL | 0 refills | Status: AC

## 2019-04-19 ENCOUNTER — Other Ambulatory Visit: Payer: Self-pay | Admitting: Internal Medicine

## 2019-10-25 ENCOUNTER — Other Ambulatory Visit: Payer: Self-pay | Admitting: Internal Medicine

## 2020-01-25 ENCOUNTER — Other Ambulatory Visit: Payer: Self-pay | Admitting: Internal Medicine

## 2020-03-29 ENCOUNTER — Ambulatory Visit (INDEPENDENT_AMBULATORY_CARE_PROVIDER_SITE_OTHER): Payer: 59 | Admitting: Internal Medicine

## 2020-03-29 ENCOUNTER — Encounter: Payer: Self-pay | Admitting: Internal Medicine

## 2020-03-29 ENCOUNTER — Other Ambulatory Visit: Payer: Self-pay

## 2020-03-29 VITALS — BP 116/74 | HR 82 | Temp 97.4°F | Ht 64.2 in | Wt 157.0 lb

## 2020-03-29 DIAGNOSIS — Z0000 Encounter for general adult medical examination without abnormal findings: Secondary | ICD-10-CM

## 2020-03-29 DIAGNOSIS — I10 Essential (primary) hypertension: Secondary | ICD-10-CM

## 2020-03-29 LAB — POCT URINALYSIS DIPSTICK
Bilirubin, UA: NEGATIVE
Glucose, UA: NEGATIVE
Ketones, UA: NEGATIVE
Leukocytes, UA: NEGATIVE
Nitrite, UA: NEGATIVE
Protein, UA: POSITIVE — AB
Spec Grav, UA: 1.02 (ref 1.010–1.025)
Urobilinogen, UA: 0.2 E.U./dL
pH, UA: 7 (ref 5.0–8.0)

## 2020-03-29 LAB — POCT UA - MICROALBUMIN
Creatinine, POC: 200 mg/dL
Microalbumin Ur, POC: 80 mg/L

## 2020-03-29 NOTE — Patient Instructions (Signed)
Health Maintenance, Female Adopting a healthy lifestyle and getting preventive care are important in promoting health and wellness. Ask your health care provider about:  The right schedule for you to have regular tests and exams.  Things you can do on your own to prevent diseases and keep yourself healthy. What should I know about diet, weight, and exercise? Eat a healthy diet   Eat a diet that includes plenty of vegetables, fruits, low-fat dairy products, and lean protein.  Do not eat a lot of foods that are high in solid fats, added sugars, or sodium. Maintain a healthy weight Body mass index (BMI) is used to identify weight problems. It estimates body fat based on height and weight. Your health care provider can help determine your BMI and help you achieve or maintain a healthy weight. Get regular exercise Get regular exercise. This is one of the most important things you can do for your health. Most adults should:  Exercise for at least 150 minutes each week. The exercise should increase your heart rate and make you sweat (moderate-intensity exercise).  Do strengthening exercises at least twice a week. This is in addition to the moderate-intensity exercise.  Spend less time sitting. Even light physical activity can be beneficial. Watch cholesterol and blood lipids Have your blood tested for lipids and cholesterol at 45 years of age, then have this test every 5 years. Have your cholesterol levels checked more often if:  Your lipid or cholesterol levels are high.  You are older than 45 years of age.  You are at high risk for heart disease. What should I know about cancer screening? Depending on your health history and family history, you may need to have cancer screening at various ages. This may include screening for:  Breast cancer.  Cervical cancer.  Colorectal cancer.  Skin cancer.  Lung cancer. What should I know about heart disease, diabetes, and high blood  pressure? Blood pressure and heart disease  High blood pressure causes heart disease and increases the risk of stroke. This is more likely to develop in people who have high blood pressure readings, are of African descent, or are overweight.  Have your blood pressure checked: ? Every 3-5 years if you are 18-39 years of age. ? Every year if you are 40 years old or older. Diabetes Have regular diabetes screenings. This checks your fasting blood sugar level. Have the screening done:  Once every three years after age 40 if you are at a normal weight and have a low risk for diabetes.  More often and at a younger age if you are overweight or have a high risk for diabetes. What should I know about preventing infection? Hepatitis B If you have a higher risk for hepatitis B, you should be screened for this virus. Talk with your health care provider to find out if you are at risk for hepatitis B infection. Hepatitis C Testing is recommended for:  Everyone born from 1945 through 1965.  Anyone with known risk factors for hepatitis C. Sexually transmitted infections (STIs)  Get screened for STIs, including gonorrhea and chlamydia, if: ? You are sexually active and are younger than 45 years of age. ? You are older than 45 years of age and your health care provider tells you that you are at risk for this type of infection. ? Your sexual activity has changed since you were last screened, and you are at increased risk for chlamydia or gonorrhea. Ask your health care provider if   you are at risk.  Ask your health care provider about whether you are at high risk for HIV. Your health care provider may recommend a prescription medicine to help prevent HIV infection. If you choose to take medicine to prevent HIV, you should first get tested for HIV. You should then be tested every 3 months for as long as you are taking the medicine. Pregnancy  If you are about to stop having your period (premenopausal) and  you may become pregnant, seek counseling before you get pregnant.  Take 400 to 800 micrograms (mcg) of folic acid every day if you become pregnant.  Ask for birth control (contraception) if you want to prevent pregnancy. Osteoporosis and menopause Osteoporosis is a disease in which the bones lose minerals and strength with aging. This can result in bone fractures. If you are 65 years old or older, or if you are at risk for osteoporosis and fractures, ask your health care provider if you should:  Be screened for bone loss.  Take a calcium or vitamin D supplement to lower your risk of fractures.  Be given hormone replacement therapy (HRT) to treat symptoms of menopause. Follow these instructions at home: Lifestyle  Do not use any products that contain nicotine or tobacco, such as cigarettes, e-cigarettes, and chewing tobacco. If you need help quitting, ask your health care provider.  Do not use street drugs.  Do not share needles.  Ask your health care provider for help if you need support or information about quitting drugs. Alcohol use  Do not drink alcohol if: ? Your health care provider tells you not to drink. ? You are pregnant, may be pregnant, or are planning to become pregnant.  If you drink alcohol: ? Limit how much you use to 0-1 drink a day. ? Limit intake if you are breastfeeding.  Be aware of how much alcohol is in your drink. In the [place] one drink equals one 12 oz bottle of beer (355 mL), one 5 oz glass of wine (148 mL), or one 1 oz glass of hard liquor (44 mL). General instructions  Schedule regular health, dental, and eye exams.  Stay current with your vaccines.  Tell your health care provider if: ? You often feel depressed. ? You have ever been abused or do not feel safe at home. Summary  Adopting a healthy lifestyle and getting preventive care are important in promoting health and wellness.  Follow your health care provider's instructions about healthy  diet, exercising, and getting tested or screened for diseases.  Follow your health care provider's instructions on monitoring your cholesterol and blood pressure. This information is not intended to replace advice given to you by your health care provider. Make sure you discuss any questions you have with your health care provider. Document Revised: 10/20/2018 Document Reviewed: 10/20/2018 Elsevier Patient Education  2020 [employer]  

## 2020-03-30 LAB — CBC
Hematocrit: 45 % (ref 34.0–46.6)
Hemoglobin: 14.5 g/dL (ref 11.1–15.9)
MCH: 30.7 pg (ref 26.6–33.0)
MCHC: 32.2 g/dL (ref 31.5–35.7)
MCV: 95 fL (ref 79–97)
Platelets: 289 10*3/uL (ref 150–450)
RBC: 4.73 x10E6/uL (ref 3.77–5.28)
RDW: 11.6 % — ABNORMAL LOW (ref 11.7–15.4)
WBC: 10.3 10*3/uL (ref 3.4–10.8)

## 2020-03-30 LAB — CMP14+EGFR
ALT: 7 IU/L (ref 0–32)
AST: 16 IU/L (ref 0–40)
Albumin/Globulin Ratio: 1.5 (ref 1.2–2.2)
Albumin: 4.2 g/dL (ref 3.8–4.8)
Alkaline Phosphatase: 74 IU/L (ref 48–121)
BUN/Creatinine Ratio: 15 (ref 9–23)
BUN: 13 mg/dL (ref 6–24)
Bilirubin Total: 0.6 mg/dL (ref 0.0–1.2)
CO2: 25 mmol/L (ref 20–29)
Calcium: 9.4 mg/dL (ref 8.7–10.2)
Chloride: 98 mmol/L (ref 96–106)
Creatinine, Ser: 0.88 mg/dL (ref 0.57–1.00)
GFR calc Af Amer: 92 mL/min/{1.73_m2} (ref >59)
GFR calc non Af Amer: 80 mL/min/{1.73_m2} (ref >59)
Globulin, Total: 2.8 g/dL (ref 1.5–4.5)
Glucose: 68 mg/dL (ref 65–99)
Potassium: 4.2 mmol/L (ref 3.5–5.2)
Sodium: 135 mmol/L (ref 134–144)
Total Protein: 7 g/dL (ref 6.0–8.5)

## 2020-03-30 LAB — LIPID PANEL
Chol/HDL Ratio: 3.3 ratio (ref 0.0–4.4)
Cholesterol, Total: 173 mg/dL (ref 100–199)
HDL: 53 mg/dL (ref >39)
LDL Chol Calc (NIH): 105 mg/dL — ABNORMAL HIGH (ref 0–99)
Triglycerides: 81 mg/dL (ref 0–149)
VLDL Cholesterol Cal: 15 mg/dL (ref 5–40)

## 2020-03-30 LAB — HEMOGLOBIN A1C
Est. average glucose Bld gHb Est-mCnc: 91 mg/dL
Hgb A1c MFr Bld: 4.8 % (ref 4.8–5.6)

## 2020-03-30 LAB — TSH: TSH: 1.44 u[IU]/mL (ref 0.450–4.500)

## 2020-03-31 DIAGNOSIS — I10 Essential (primary) hypertension: Secondary | ICD-10-CM | POA: Insufficient documentation

## 2020-03-31 NOTE — Progress Notes (Signed)
This visit occurred during the SARS-CoV-2 public health emergency.  Safety protocols were in place, including screening questions prior to the visit, additional usage of staff PPE, and extensive cleaning of exam room while observing appropriate contact time as indicated for disinfecting solutions.  Subjective:     Patient ID: [name] , female    DOB: 05-04-1976 , 44 y.o.   MRN: [id number]   Chief Complaint  Patient presents with  . Annual Exam    HPI  She is here today for a full physical exam. She was previously followed by Dr. Raphael Gibney for her GYN care, but he has since retired. She has yet to have f/u appt.   Hypertension This is a chronic problem. The current episode started more than 1 year ago. The problem has been gradually improving since onset. The problem is controlled. Pertinent negatives include no blurred vision, chest pain, palpitations or shortness of breath. The current treatment provides moderate improvement.     Past Medical History:  Diagnosis Date  . Hx of colostomy      Family History  Problem Relation Age of Onset  . Hyperlipidemia Mother   . Cancer Maternal Grandmother   . Hypertension Maternal Grandmother   . Hypertension Maternal Grandfather   . Heart disease Maternal Grandfather   . Stroke Paternal Grandfather   . Hypertension Paternal Grandfather   . Heart attack Paternal Grandfather   . Hypertension Father   . Diabetes Father   . Hypertension Paternal Grandmother      Current Outpatient Medications:  .  ferrous sulfate 325 (65 FE) MG tablet, Take 1 tablet (325 mg total) by mouth 2 (two) times daily with a meal., Disp: 60 tablet, Rfl: 3 .  hydrochlorothiazide (HYDRODIURIL) 12.5 MG tablet, TAKE 1 TABLET BY MOUTH EVERY DAY, Disp: 30 tablet, Rfl: 2 .  VITAMIN D PO, Take 25 mcg by mouth. 1 per day, Disp: , Rfl:    No Known Allergies    The patient states she uses none for birth control. Last LMP was Patient's last menstrual period  was 03/12/2020.. Negative for Dysmenorrhea  Negative for: breast discharge, breast lump(s), breast pain and breast self exam. Associated symptoms include abnormal vaginal bleeding. Pertinent negatives include abnormal bleeding (hematology), anxiety, decreased libido, depression, difficulty falling sleep, dyspareunia, history of infertility, nocturia, sexual dysfunction, sleep disturbances, urinary incontinence, urinary urgency, vaginal discharge and vaginal itching. Diet regular.The patient states her exercise level is  intermittent.   . The patient's tobacco use is:  Social History   Tobacco Use  Smoking Status Former Smoker  . Years: 7.00  . Types: Cigarettes  Smokeless Tobacco Never Used  Tobacco Comment   1 -2 only when going out  . She has been exposed to passive smoke. The patient's alcohol use is:  Social History   Substance and Sexual Activity  Alcohol Use No    Review of Systems  Constitutional: Negative.   HENT: Negative.   Eyes: Negative.  Negative for blurred vision.  Respiratory: Negative.  Negative for shortness of breath.   Cardiovascular: Negative.  Negative for chest pain and palpitations.  Endocrine: Negative.   Genitourinary: Negative.   Musculoskeletal: Negative.   Skin: Negative.   Allergic/Immunologic: Negative.   Neurological: Negative.   Hematological: Negative.   Psychiatric/Behavioral: Negative.      Today's Vitals   03/29/20 1033  BP: 116/74  Pulse: 82  Temp: (!) 97.4 F (36.3 C)  TempSrc: Oral  Weight: 157 lb (71.2 kg)  Height: 5'  4.2" (1.631 m)   Body mass index is 26.78 kg/m.   Objective:  Physical Exam Vitals and nursing note reviewed.  Constitutional:      Appearance: Normal appearance.  HENT:     Head: Normocephalic and atraumatic.     Right Ear: Tympanic membrane, ear canal and external ear normal.     Left Ear: Tympanic membrane, ear canal and external ear normal.     Nose:     Comments: Deferred, masked    Mouth/Throat:      Comments: Deferred, masked Eyes:     Extraocular Movements: Extraocular movements intact.     Conjunctiva/sclera: Conjunctivae normal.     Pupils: Pupils are equal, round, and reactive to light.  Cardiovascular:     Rate and Rhythm: Normal rate and regular rhythm.     Pulses: Normal pulses.     Heart sounds: Normal heart sounds.  Pulmonary:     Effort: Pulmonary effort is normal.     Breath sounds: Normal breath sounds.  Chest:     Breasts: Tanner Score is 5.        Right: Normal.        Left: Normal.  Abdominal:     General: Abdomen is flat. Bowel sounds are normal.     Palpations: Abdomen is soft.  Genitourinary:    Comments: deferred Musculoskeletal:        General: Normal range of motion.     Cervical back: Normal range of motion and neck supple.  Skin:    General: Skin is warm and dry.     Comments: Lots of scarring on torso  Neurological:     General: No focal deficit present.     Mental Status: She is alert and oriented to person, place, and time.  Psychiatric:        Mood and Affect: Mood normal.        Behavior: Behavior normal.         Assessment And Plan:     1. Routine general medical examination at health care facility  A full exam was performed.  Importance of monthly self breast exams was discussed with the patient. Her GYN has retired, I will refer her to Coralyn Mark for her GYN care. This is the same practice as previous provider.  PATIENT IS ADVISED TO GET 30-45 MINUTES REGULAR EXERCISE NO LESS THAN FOUR TO FIVE DAYS PER WEEK - BOTH WEIGHTBEARING EXERCISES AND AEROBIC ARE RECOMMENDED.  SHE IS ADVISED TO FOLLOW A HEALTHY DIET WITH AT LEAST SIX FRUITS/VEGGIES PER DAY, DECREASE INTAKE OF RED MEAT, AND TO INCREASE FISH INTAKE TO TWO DAYS PER WEEK.  MEATS/FISH SHOULD NOT BE FRIED, BAKED OR BROILED IS PREFERABLE.  I SUGGEST WEARING SPF 50 SUNSCREEN ON EXPOSED PARTS AND ESPECIALLY WHEN IN THE DIRECT SUNLIGHT FOR AN EXTENDED PERIOD OF TIME.  PLEASE AVOID  FAST FOOD RESTAURANTS AND INCREASE YOUR WATER INTAKE.  - CMP14+EGFR - CBC - Lipid panel - Hemoglobin A1c - TSH  2. Essential hypertension, benign  Chronic, well controlled.  She will continue with current meds. She is encouraged to avoid adding salt to her foods. We discussed trying to decrease her meds over the next several months. She is encouraged to incorporate more exercise into her daily routine. EKG performed, NSR w/o right axis, possible RVH. I will refer her for 2d-echocardiogram. She is in agreement with referral. She will rto in six months for re-evaluation.   - POCT Urinalysis Dipstick (32671) - POCT UA -  Microalbumin - EKG 12-Lead    Maximino Greenland, MD    THE PATIENT IS ENCOURAGED TO PRACTICE SOCIAL DISTANCING DUE TO THE COVID-19 PANDEMIC.

## 2020-04-18 ENCOUNTER — Other Ambulatory Visit: Payer: Self-pay

## 2020-04-18 ENCOUNTER — Ambulatory Visit
Admission: RE | Admit: 2020-04-18 | Discharge: 2020-04-18 | Disposition: A | Discharge status: Home / Self Care | Payer: 59 | Source: Ambulatory Visit | Attending: Internal Medicine | Admitting: Internal Medicine

## 2020-04-18 DIAGNOSIS — Z0000 Encounter for general adult medical examination without abnormal findings: Secondary | ICD-10-CM

## 2020-04-19 LAB — HIV ANTIBODY (ROUTINE TESTING W REFLEX): HIV 1&2 Ab, 4th Generation: NEGATIVE

## 2020-05-05 ENCOUNTER — Other Ambulatory Visit: Payer: Self-pay | Admitting: Internal Medicine

## 2020-06-15 ENCOUNTER — Encounter: Payer: Self-pay | Admitting: Nurse Practitioner

## 2020-09-09 IMAGING — MG DIGITAL SCREENING BILAT W/ TOMO W/ CAD
8 series · 8 of 24 positions shown · non-contrast
Comparison: Previous exam(s).

CLINICAL DATA: Screening.

EXAM:
DIGITAL SCREENING BILATERAL MAMMOGRAM WITH TOMO AND CAD

[R CC synth-2D]
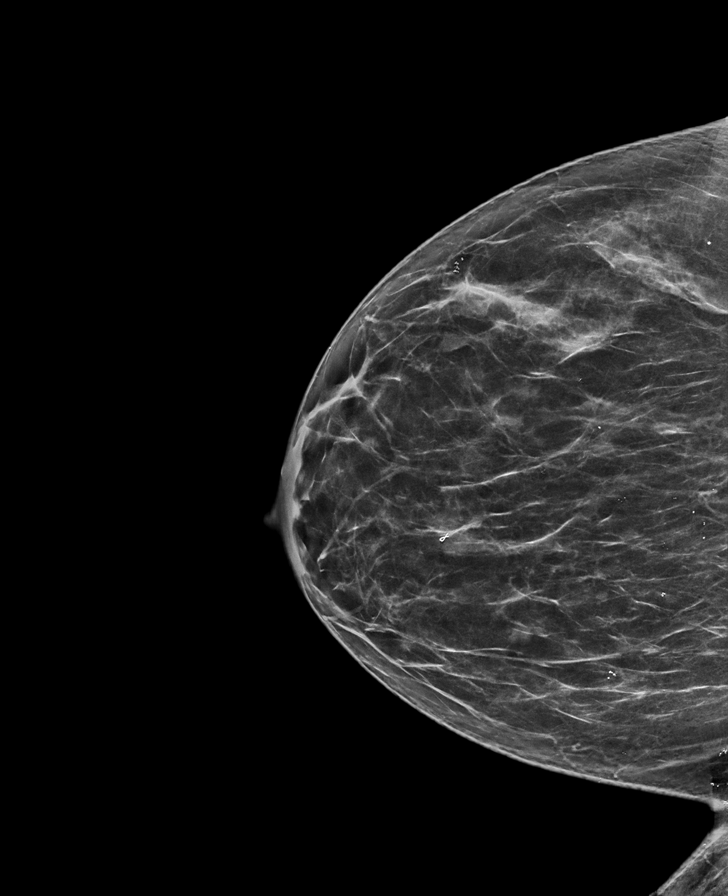

[R MLO synth-2D]
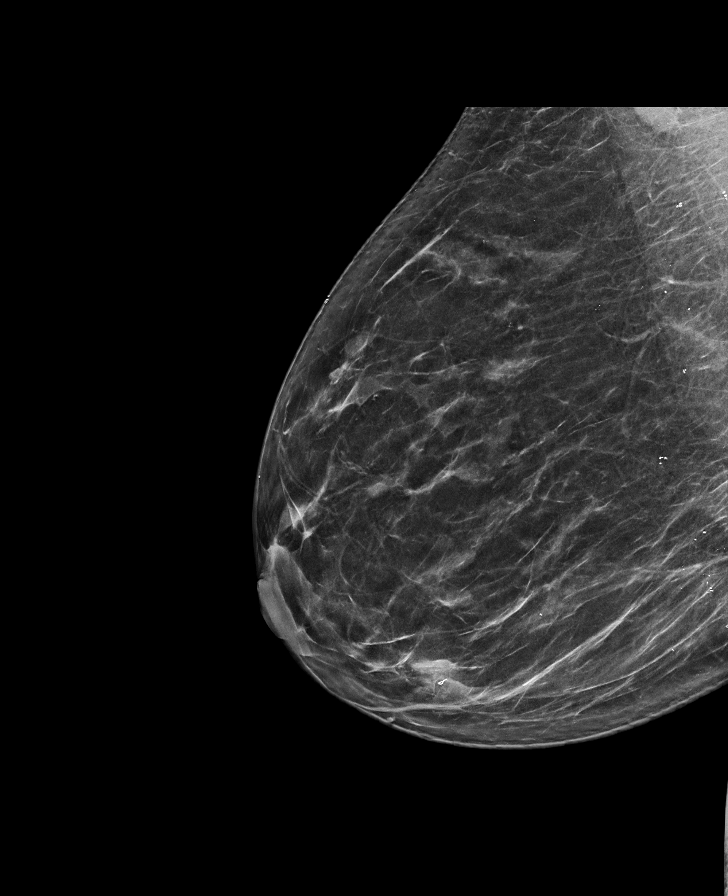

[L CC synth-2D]
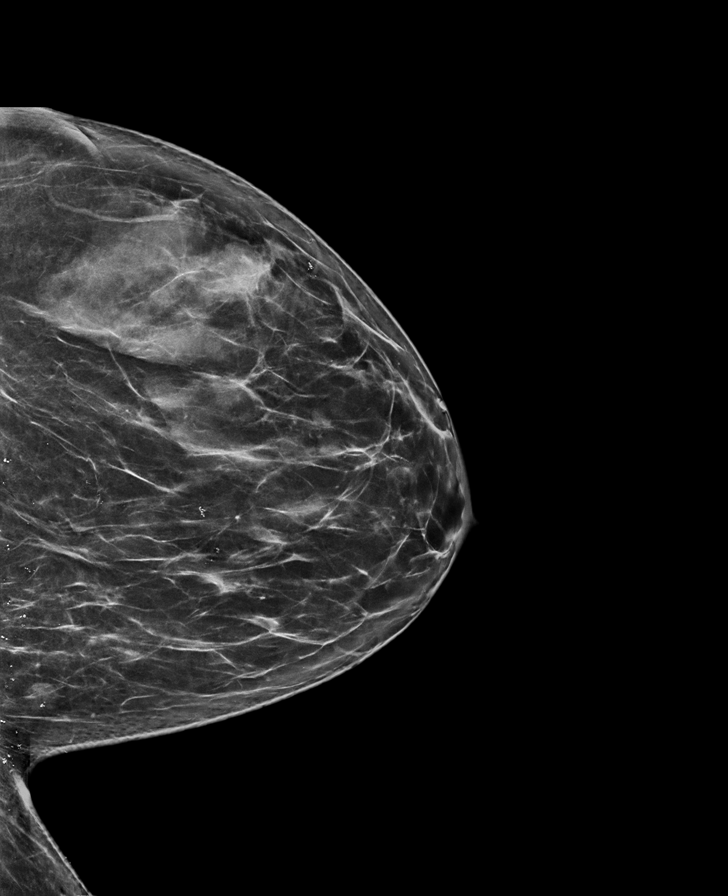

[L MLO synth-2D]
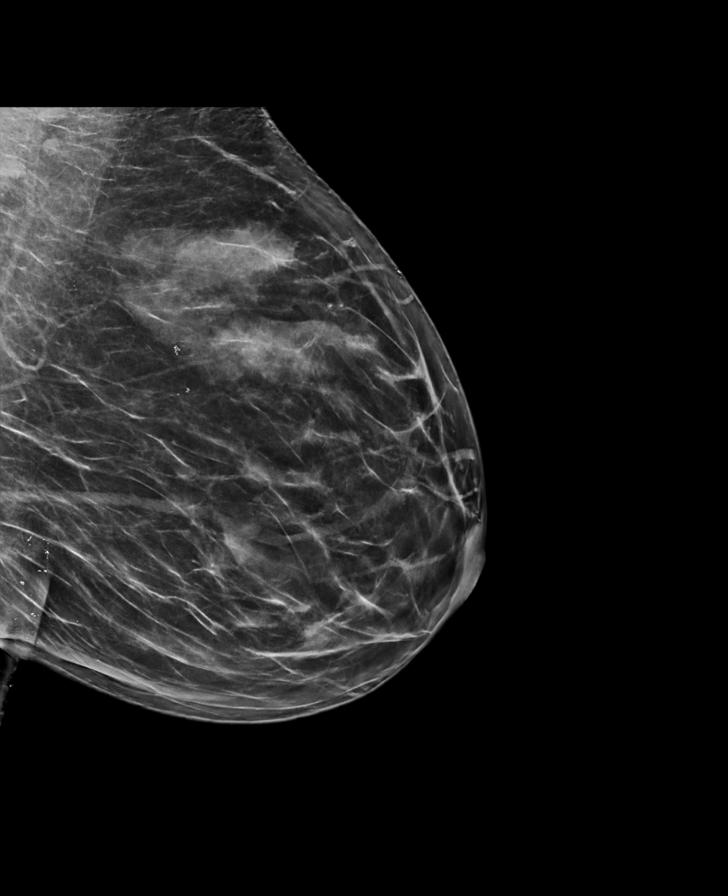

[L CC tomo · tomo slice 37/74.0]
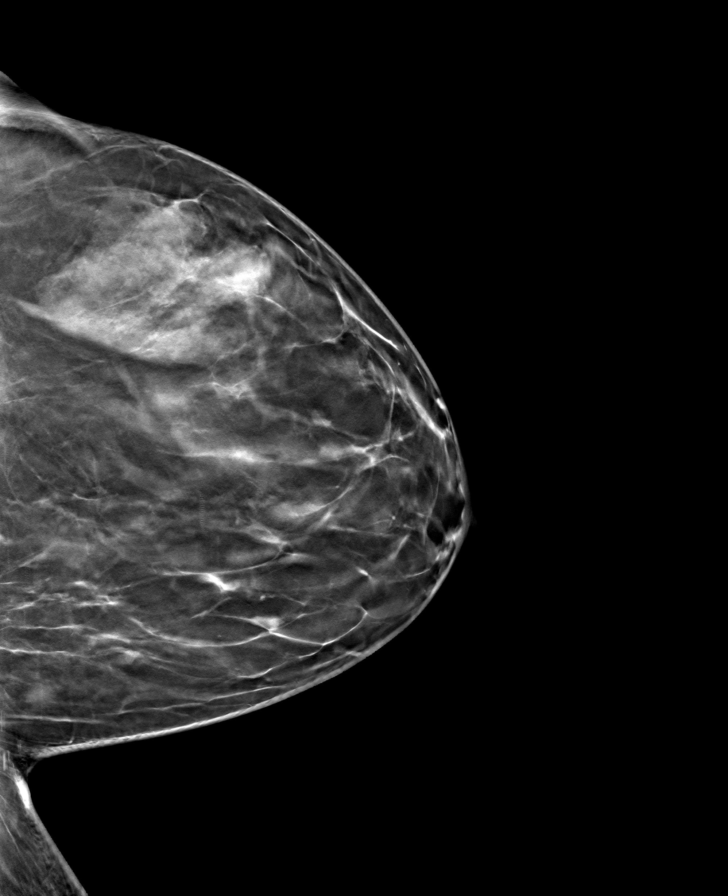

[R CC tomo · tomo slice 35/70.0]
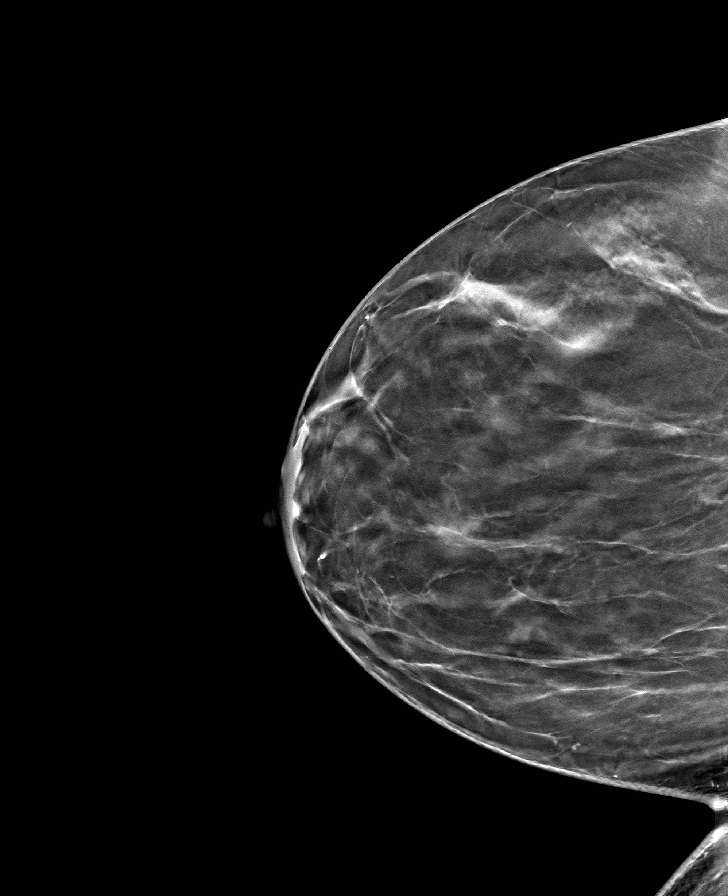

[L MLO tomo · tomo slice 39/76.0]
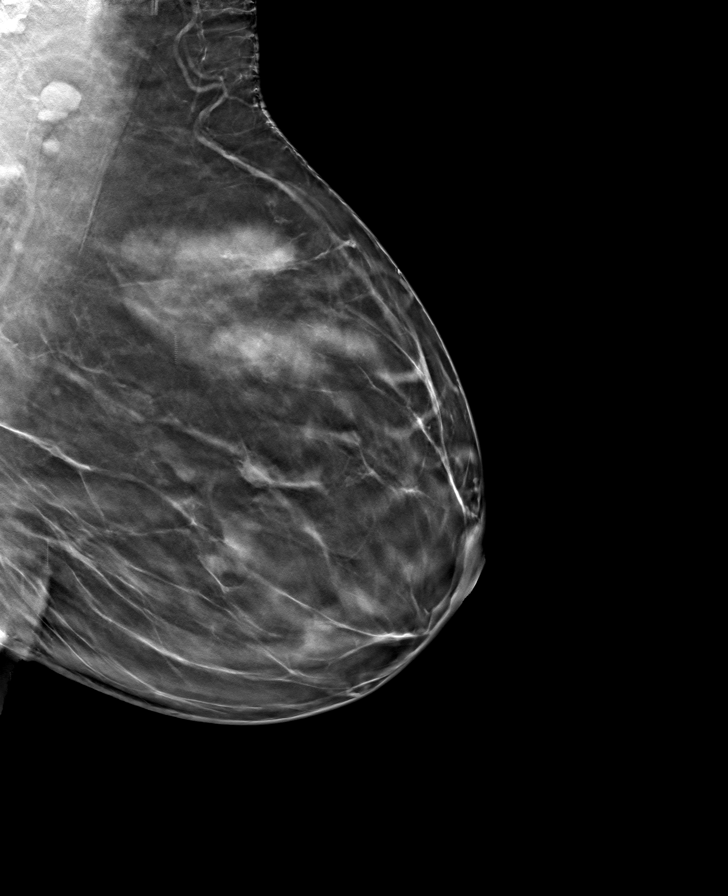

[R MLO tomo · tomo slice 38/75.0]
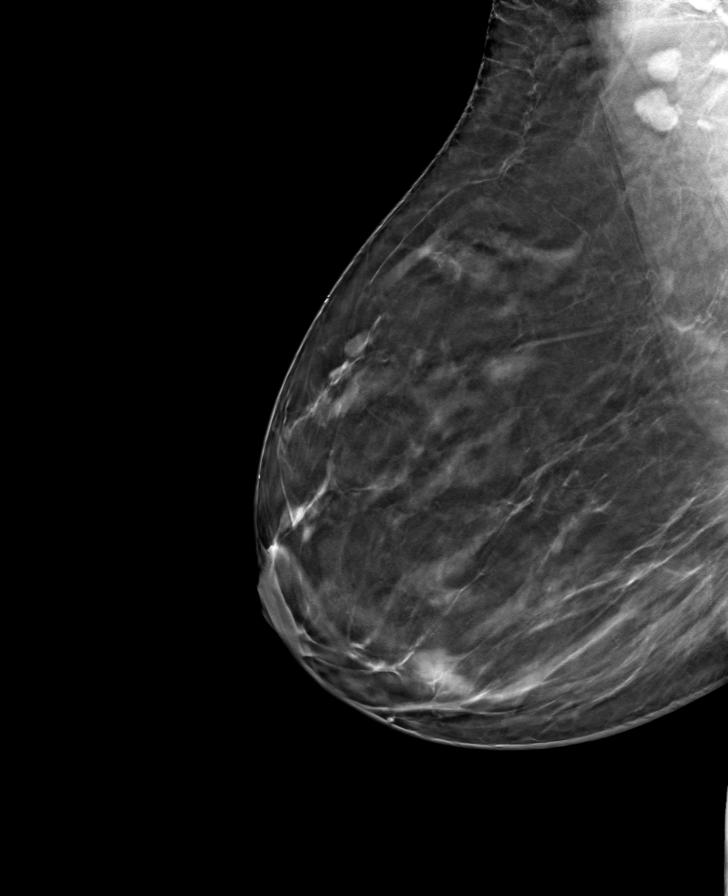

[8 of 24 positions shown; findings below may reference images not displayed]

ACR Breast Density Category b: There are scattered areas of
fibroglandular density.
FINDINGS: There are no findings suspicious for malignancy. Images were
processed with CAD.
IMPRESSION: No mammographic evidence of malignancy. A result letter of this
screening mammogram will be mailed directly to the patient.

RECOMMENDATION:
Screening mammogram in one year. (Code:[id number])

BI-RADS CATEGORY  1: Negative.

## 2020-10-09 ENCOUNTER — Ambulatory Visit: Payer: Self-pay | Admitting: Internal Medicine

## 2020-11-14 ENCOUNTER — Encounter: Payer: Self-pay | Admitting: Internal Medicine

## 2021-01-02 ENCOUNTER — Other Ambulatory Visit: Payer: Self-pay | Admitting: Internal Medicine

## 2021-04-04 ENCOUNTER — Encounter: Payer: 59 | Admitting: Internal Medicine

## 2021-04-06 ENCOUNTER — Other Ambulatory Visit: Payer: Self-pay | Admitting: Internal Medicine

## 2021-06-10 ENCOUNTER — Other Ambulatory Visit: Payer: Self-pay

## 2021-06-10 ENCOUNTER — Ambulatory Visit (INDEPENDENT_AMBULATORY_CARE_PROVIDER_SITE_OTHER): Payer: 59 | Admitting: Internal Medicine

## 2021-06-10 ENCOUNTER — Encounter: Payer: Self-pay | Admitting: Internal Medicine

## 2021-06-10 VITALS — BP 132/70 | HR 84 | Temp 98.8°F | Ht 64.2 in | Wt 156.2 lb

## 2021-06-10 DIAGNOSIS — Z1211 Encounter for screening for malignant neoplasm of colon: Secondary | ICD-10-CM | POA: Diagnosis not present

## 2021-06-10 DIAGNOSIS — Z6826 Body mass index (BMI) 26.0-26.9, adult: Secondary | ICD-10-CM | POA: Diagnosis not present

## 2021-06-10 DIAGNOSIS — E663 Overweight: Secondary | ICD-10-CM | POA: Diagnosis not present

## 2021-06-10 DIAGNOSIS — Z0000 Encounter for general adult medical examination without abnormal findings: Secondary | ICD-10-CM

## 2021-06-10 DIAGNOSIS — I10 Essential (primary) hypertension: Secondary | ICD-10-CM

## 2021-06-10 NOTE — Progress Notes (Signed)
I,[name],acting as a scribe for [name], MD.,have documented all relevant documentation on the behalf of [name], MD,as directed by  [name], MD while in the presence of [name], MD.  This visit occurred during the SARS-CoV-2 public health emergency.  Safety protocols were in place, including screening questions prior to the visit, additional usage of staff PPE, and extensive cleaning of exam room while observing appropriate contact time as indicated for disinfecting solutions.  Subjective:     Patient ID: [name] , female    DOB: 05/20/1976 , 45 y.o.   MRN: [id number]   Chief Complaint  Patient presents with   Annual Exam    HPI  Patient is here for physical exam. She is followed by [facility] for her gyn care. She is now followed by [facility], [place] since Dr. Stringer has retired.    Hypertension This is a chronic problem. The current episode started more than 1 year ago. The problem has been gradually improving since onset. The problem is controlled. Pertinent negatives include no blurred vision, chest pain, palpitations or shortness of breath. The current treatment provides moderate improvement.    Past Medical History:  Diagnosis Date   Hx of colostomy      Family History  Problem Relation Age of Onset   Hyperlipidemia Mother    Cancer Maternal Grandmother    Hypertension Maternal Grandmother    Hypertension Maternal Grandfather    Heart disease Maternal Grandfather    Stroke Paternal Grandfather    Hypertension Paternal Grandfather    Heart attack Paternal Grandfather    Hypertension Father    Diabetes Father    Hypertension Paternal Grandmother      Current Outpatient Medications:    ferrous sulfate 325 (65 FE) MG tablet, Take 1 tablet (325 mg total) by mouth 2 (two) times daily with a meal., Disp: 60 tablet, Rfl: 3   VITAMIN D PO, Take 25 mcg by mouth. 1 per day, Disp: , Rfl:    No Known Allergies    The patient  states she uses none for birth control. Last LMP was Patient's last menstrual period was 06/06/2021.. Negative for Dysmenorrhea. Negative for: breast discharge, breast lump(s), breast pain and breast self exam. Associated symptoms include abnormal vaginal bleeding. Pertinent negatives include abnormal bleeding (hematology), anxiety, decreased libido, depression, difficulty falling sleep, dyspareunia, history of infertility, nocturia, sexual dysfunction, sleep disturbances, urinary incontinence, urinary urgency, vaginal discharge and vaginal itching. Diet regular.The patient states her exercise level is  intermittent.  . The patient's tobacco use is:  Social History   Tobacco Use  Smoking Status Former   Years: 7.00   Types: Cigarettes  Smokeless Tobacco Never  Tobacco Comments   1 -2 only when going out  . She has been exposed to passive smoke. The patient's alcohol use is:  Social History   Substance and Sexual Activity  Alcohol Use No    Review of Systems  Eyes:  Negative for blurred vision.  Respiratory:  Negative for shortness of breath.   Cardiovascular:  Negative for chest pain and palpitations.    Today's Vitals   06/10/21 1504  BP: 132/70  Pulse: 84  Temp: 98.8 F (37.1 C)  TempSrc: Oral  Weight: 156 lb 3.2 oz (70.9 kg)  Height: 5' 4.2" (1.631 m)   Body mass index is 26.64 kg/m.  Wt Readings from Last 3 Encounters:  06/10/21 156 lb 3.2 oz (70.9 kg)  03/29/20 157 lb (71.2   kg)  03/28/19 167 lb 6.4 oz (75.9 kg)   BP Readings from Last 3 Encounters:  06/10/21 132/70  03/29/20 116/74  03/28/19 122/86     Objective:  Physical Exam Vitals and nursing note reviewed.  Constitutional:      Appearance: Normal appearance.  HENT:     Head: Normocephalic and atraumatic.     Right Ear: Tympanic membrane, ear canal and external ear normal.     Left Ear: Tympanic membrane, ear canal and external ear normal.     Nose: Nose normal.     Mouth/Throat:     Mouth: Mucous  membranes are moist.     Pharynx: Oropharynx is clear.  Eyes:     Extraocular Movements: Extraocular movements intact.     Conjunctiva/sclera: Conjunctivae normal.     Pupils: Pupils are equal, round, and reactive to light.  Cardiovascular:     Rate and Rhythm: Normal rate and regular rhythm.     Pulses: Normal pulses.     Heart sounds: Normal heart sounds.  Pulmonary:     Effort: Pulmonary effort is normal.     Breath sounds: Normal breath sounds.  Chest:  Breasts:    Tanner Score is 5.     Right: Normal.     Left: Normal.  Abdominal:     General: Abdomen is flat. Bowel sounds are normal.     Palpations: Abdomen is soft.  Genitourinary:    Comments: deferred Musculoskeletal:        General: Normal range of motion.     Cervical back: Normal range of motion and neck supple.  Skin:    General: Skin is warm and dry.  Neurological:     General: No focal deficit present.     Mental Status: She is alert and oriented to person, place, and time.  Psychiatric:        Mood and Affect: Mood normal.        Behavior: Behavior normal.        Assessment And Plan:     1. Routine general medical examination at health care facility Comments: A full exam was performed. Importance of monthly self breast exams was discussed with the patient. PATIENT IS ADVISED TO GET 30-45 MINUTES REGULAR EXERCISE NO LESS THAN FOUR TO FIVE DAYS PER WEEK - BOTH WEIGHTBEARING EXERCISES AND AEROBIC ARE RECOMMENDED.  PATIENT IS ADVISED TO FOLLOW A HEALTHY DIET WITH AT LEAST SIX FRUITS/VEGGIES PER DAY, DECREASE INTAKE OF RED MEAT, AND TO INCREASE FISH INTAKE TO TWO DAYS PER WEEK.  MEATS/FISH SHOULD NOT BE FRIED, BAKED OR BROILED IS PREFERABLE.  IT IS ALSO IMPORTANT TO CUT BACK ON YOUR SUGAR INTAKE. PLEASE AVOID ANYTHING WITH ADDED SUGAR, CORN SYRUP OR OTHER SWEETENERS. IF YOU MUST USE A SWEETENER, YOU CAN TRY STEVIA. IT IS ALSO IMPORTANT TO AVOID ARTIFICIALLY SWEETENERS AND DIET BEVERAGES. LASTLY, I SUGGEST WEARING SPF  50 SUNSCREEN ON EXPOSED PARTS AND ESPECIALLY WHEN IN THE DIRECT SUNLIGHT FOR AN EXTENDED PERIOD OF TIME.  PLEASE AVOID FAST FOOD RESTAURANTS AND INCREASE YOUR WATER INTAKE.  - CBC - CMP14+EGFR - Lipid panel - Hepatitis C antibody  2. Overweight with body mass index (BMI) of 26 to 26.9 in adult Comments: She is encouraged to aim for at least 150 minutes of exercise per week.   3. Screen for colon cancer Comments: I will refer her to GI for CRC screening.  - Ambulatory referral to Gastroenterology  Patient was given opportunity to ask questions. Patient verbalized understanding  of the plan and was able to repeat key elements of the plan. All questions were answered to their satisfaction.   I, Maximino Greenland, MD, have reviewed all documentation for this visit. The documentation on 06/16/21 for the exam, diagnosis, procedures, and orders are all accurate and complete.  THE PATIENT IS ENCOURAGED TO PRACTICE SOCIAL DISTANCING DUE TO THE COVID-19 PANDEMIC.

## 2021-06-10 NOTE — Patient Instructions (Signed)
Health Maintenance, Female Adopting a healthy lifestyle and getting preventive care are important in promoting health and wellness. Ask your health care provider about: The right schedule for you to have regular tests and exams. Things you can do on your own to prevent diseases and keep yourself healthy. What should I know about diet, weight, and exercise? Eat a healthy diet  Eat a diet that includes plenty of vegetables, fruits, low-fat dairy products, and lean protein. Do not eat a lot of foods that are high in solid fats, added sugars, or sodium.  Maintain a healthy weight Body mass index (BMI) is used to identify weight problems. It estimates body fat based on height and weight. Your health care provider can help determineyour BMI and help you achieve or maintain a healthy weight. Get regular exercise Get regular exercise. This is one of the most important things you can do for your health. Most adults should: Exercise for at least 150 minutes each week. The exercise should increase your heart rate and make you sweat (moderate-intensity exercise). Do strengthening exercises at least twice a week. This is in addition to the moderate-intensity exercise. Spend less time sitting. Even light physical activity can be beneficial. Watch cholesterol and blood lipids Have your blood tested for lipids and cholesterol at 46 years of age, then havethis test every 5 years. Have your cholesterol levels checked more often if: Your lipid or cholesterol levels are high. You are older than 46 years of age. You are at high risk for heart disease. What should I know about cancer screening? Depending on your health history and family history, you may need to have cancer screening at various ages. This may include screening for: Breast cancer. Cervical cancer. Colorectal cancer. Skin cancer. Lung cancer. What should I know about heart disease, diabetes, and high blood pressure? Blood pressure and heart  disease High blood pressure causes heart disease and increases the risk of stroke. This is more likely to develop in people who have high blood pressure readings, are of African descent, or are overweight. Have your blood pressure checked: Every 3-5 years if you are 18-39 years of age. Every year if you are 40 years old or older. Diabetes Have regular diabetes screenings. This checks your fasting blood sugar level. Have the screening done: Once every three years after age 40 if you are at a normal weight and have a low risk for diabetes. More often and at a younger age if you are overweight or have a high risk for diabetes. What should I know about preventing infection? Hepatitis B If you have a higher risk for hepatitis B, you should be screened for this virus. Talk with your health care provider to find out if you are at risk forhepatitis B infection. Hepatitis C Testing is recommended for: Everyone born from 1945 through 1965. Anyone with known risk factors for hepatitis C. Sexually transmitted infections (STIs) Get screened for STIs, including gonorrhea and chlamydia, if: You are sexually active and are younger than 46 years of age. You are older than 46 years of age and your health care provider tells you that you are at risk for this type of infection. Your sexual activity has changed since you were last screened, and you are at increased risk for chlamydia or gonorrhea. Ask your health care provider if you are at risk. Ask your health care provider about whether you are at high risk for HIV. Your health care provider may recommend a prescription medicine to help   prevent HIV infection. If you choose to take medicine to prevent HIV, you should first get tested for HIV. You should then be tested every 3 months for as long as you are taking the medicine. Pregnancy If you are about to stop having your period (premenopausal) and you may become pregnant, seek counseling before you get  pregnant. Take 400 to 800 micrograms (mcg) of folic acid every day if you become pregnant. Ask for birth control (contraception) if you want to prevent pregnancy. Osteoporosis and menopause Osteoporosis is a disease in which the bones lose minerals and strength with aging. This can result in bone fractures. If you are 65 years old or older, or if you are at risk for osteoporosis and fractures, ask your health care provider if you should: Be screened for bone loss. Take a calcium or vitamin D supplement to lower your risk of fractures. Be given hormone replacement therapy (HRT) to treat symptoms of menopause. Follow these instructions at home: Lifestyle Do not use any products that contain nicotine or tobacco, such as cigarettes, e-cigarettes, and chewing tobacco. If you need help quitting, ask your health care provider. Do not use street drugs. Do not share needles. Ask your health care provider for help if you need support or information about quitting drugs. Alcohol use Do not drink alcohol if: Your health care provider tells you not to drink. You are pregnant, may be pregnant, or are planning to become pregnant. If you drink alcohol: Limit how much you use to 0-1 drink a day. Limit intake if you are breastfeeding. Be aware of how much alcohol is in your drink. In the [place] one drink equals one 12 oz bottle of beer (355 mL), one 5 oz glass of wine (148 mL), or one 1 oz glass of hard liquor (44 mL). General instructions Schedule regular health, dental, and eye exams. Stay current with your vaccines. Tell your health care provider if: You often feel depressed. You have ever been abused or do not feel safe at home. Summary Adopting a healthy lifestyle and getting preventive care are important in promoting health and wellness. Follow your health care provider's instructions about healthy diet, exercising, and getting tested or screened for diseases. Follow your health care provider's  instructions on monitoring your cholesterol and blood pressure. This information is not intended to replace advice given to you by your health care provider. Make sure you discuss any questions you have with your healthcare provider. Document Revised: 10/20/2018 Document Reviewed: 10/20/2018 Elsevier Patient Education  2022 Elsevier Inc.  

## 2021-06-11 LAB — CBC
Hematocrit: 39.9 % (ref 34.0–46.6)
Hemoglobin: 12.7 g/dL (ref 11.1–15.9)
MCH: 29.7 pg (ref 26.6–33.0)
MCHC: 31.8 g/dL (ref 31.5–35.7)
MCV: 93 fL (ref 79–97)
Platelets: 242 10*3/uL (ref 150–450)
RBC: 4.28 x10E6/uL (ref 3.77–5.28)
RDW: 11.9 % (ref 11.7–15.4)
WBC: 8.4 10*3/uL (ref 3.4–10.8)

## 2021-06-11 LAB — CMP14+EGFR
ALT: 8 IU/L (ref 0–32)
AST: 15 IU/L (ref 0–40)
Albumin/Globulin Ratio: 1.4 (ref 1.2–2.2)
Albumin: 4.1 g/dL (ref 3.8–4.8)
Alkaline Phosphatase: 66 IU/L (ref 44–121)
BUN/Creatinine Ratio: 13 (ref 9–23)
BUN: 12 mg/dL (ref 6–24)
Bilirubin Total: 0.5 mg/dL (ref 0.0–1.2)
CO2: 23 mmol/L (ref 20–29)
Calcium: 9.3 mg/dL (ref 8.7–10.2)
Chloride: 104 mmol/L (ref 96–106)
Creatinine, Ser: 0.93 mg/dL (ref 0.57–1.00)
Globulin, Total: 2.9 g/dL (ref 1.5–4.5)
Glucose: 75 mg/dL (ref 65–99)
Potassium: 4.7 mmol/L (ref 3.5–5.2)
Sodium: 139 mmol/L (ref 134–144)
Total Protein: 7 g/dL (ref 6.0–8.5)
eGFR: 77 mL/min/{1.73_m2} (ref >59)

## 2021-06-11 LAB — LIPID PANEL
Chol/HDL Ratio: 2.9 ratio (ref 0.0–4.4)
Cholesterol, Total: 171 mg/dL (ref 100–199)
HDL: 60 mg/dL (ref >39)
LDL Chol Calc (NIH): 98 mg/dL (ref 0–99)
Triglycerides: 68 mg/dL (ref 0–149)
VLDL Cholesterol Cal: 13 mg/dL (ref 5–40)

## 2021-06-11 LAB — HEPATITIS C ANTIBODY: Hep C Virus Ab: <0.1 s/co ratio (ref 0.0–0.9)

## 2021-06-16 DIAGNOSIS — E663 Overweight: Secondary | ICD-10-CM | POA: Insufficient documentation

## 2021-09-26 ENCOUNTER — Other Ambulatory Visit: Payer: Self-pay

## 2021-09-26 ENCOUNTER — Ambulatory Visit (INDEPENDENT_AMBULATORY_CARE_PROVIDER_SITE_OTHER): Payer: 59 | Admitting: Nurse Practitioner

## 2021-09-26 ENCOUNTER — Encounter: Payer: Self-pay | Admitting: Nurse Practitioner

## 2021-09-26 ENCOUNTER — Encounter: Payer: Self-pay | Admitting: Internal Medicine

## 2021-09-26 VITALS — BP 160/92 | HR 84 | Temp 98.3°F | Ht 63.8 in | Wt 162.8 lb

## 2021-09-26 DIAGNOSIS — I10 Essential (primary) hypertension: Secondary | ICD-10-CM

## 2021-09-26 MED ORDER — HYDROCHLOROTHIAZIDE 12.5 MG PO TABS: 12.5000 mg | ORAL_TABLET | Freq: Every day | ORAL | 1 refills | Status: DC

## 2021-09-26 NOTE — Progress Notes (Signed)
I,[name],acting as a Education administrator for [employer], FNP.,have documented all relevant documentation on the behalf of Minette Brine, FNP,as directed by  Minette Brine, FNP while in the presence of Minette Brine, [place].  This visit occurred during the SARS-CoV-2 public health emergency.  Safety protocols were in place, including screening questions prior to the visit, additional usage of staff PPE, and extensive cleaning of exam room while observing appropriate contact time as indicated for disinfecting solutions.  Subjective:     Patient ID: [name] , female    DOB: 12/08/1975 , 45 y.o.   MRN: [id number]   Chief Complaint  Patient presents with   Hypertension    HPI  Patient is here for elevated bp. She was at Dr Lorie Apley office for her consultation for her colonoscopy and her bp was 176/111 with the automatic bp cuff. Here in our office is was 160/92 manually. Pt denies headache blurred vision and nausea.  Denies any changes to her diet other than eating a few more hot dogs. 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 She gets good rest when she sleeps. She is not under any additional stress.   Wt Readings from Last 3 Encounters: 09/26/21 : 162 lb 12.8 oz (73.8 kg) 06/10/21 : 156 lb 3.2 oz (70.9 kg) 03/29/20 : 157 lb (71.2 kg)    Hypertension This is a recurrent problem. The current episode started today. The problem is uncontrolled. Pertinent negatives include no anxiety, blurred vision, chest pain, headaches, palpitations or peripheral edema. Agents associated with hypertension include NSAIDs (few nsaids).    Past Medical History:  Diagnosis Date   Hx of colostomy      Family History  Problem Relation Age of Onset   Hyperlipidemia Mother    Cancer Maternal Grandmother    Hypertension Maternal Grandmother    Hypertension Maternal Grandfather    Heart disease Maternal Grandfather    Stroke Paternal Grandfather    Hypertension Paternal Grandfather     Heart attack Paternal Grandfather    Hypertension Father    Diabetes Father    Hypertension Paternal Grandmother      Current Outpatient Medications:    hydrochlorothiazide (HYDRODIURIL) 12.5 MG tablet, Take 1 tablet (12.5 mg total) by mouth daily., Disp: 90 tablet, Rfl: 1   ferrous sulfate 325 (65 FE) MG tablet, Take 1 tablet (325 mg total) by mouth 2 (two) times daily with a meal., Disp: 60 tablet, Rfl: 3   VITAMIN D PO, Take 25 mcg by mouth. 1 per day, Disp: , Rfl:    No Known Allergies   Review of Systems  Constitutional: Negative.   Eyes:  Negative for blurred vision.  Respiratory: Negative.    Cardiovascular: Negative.  Negative for chest pain and palpitations.  Gastrointestinal: Negative.   Neurological: Negative.  Negative for headaches.    Today's Vitals   09/26/21 1036  BP: (!) 160/92  Pulse: 84  Temp: 98.3 F (36.8 C)  TempSrc: Oral  Weight: 162 lb 12.8 oz (73.8 kg)  Height: 5' 3.8" (1.621 m)   Body mass index is 28.12 kg/m.   Objective:  Physical Exam Vitals reviewed.  Constitutional:      General: She is not in acute distress.    Appearance: Normal appearance. She is well-developed.  HENT:     Head: Normocephalic and atraumatic.  Eyes:     Pupils: Pupils are equal, round, and reactive to light.  Cardiovascular:     Rate and Rhythm:  Normal rate and regular rhythm.     Pulses: Normal pulses.     Heart sounds: Normal heart sounds. No murmur heard. Pulmonary:     Effort: Pulmonary effort is normal. No respiratory distress.     Breath sounds: Normal breath sounds. No wheezing.  Musculoskeletal:        General: Normal range of motion.  Skin:    General: Skin is warm and dry.     Capillary Refill: Capillary refill takes less than 2 seconds.  Neurological:     General: No focal deficit present.     Mental Status: She is alert and oriented to person, place, and time.     Cranial Nerves: No cranial nerve deficit.  Psychiatric:        Mood and Affect:  Mood normal.        Behavior: Behavior normal.        Thought Content: Thought content normal.        Judgment: Judgment normal.        Assessment And Plan:     1. Essential hypertension, benign Comments: EKG done no changes from previous, will restart HCTZ. Encouraged to cut back on high salt foods.  - EKG 12-Lead - hydrochlorothiazide (HYDRODIURIL) 12.5 MG tablet; Take 1 tablet (12.5 mg total) by mouth daily.  Dispense: 90 tablet; Refill: 1    Patient was given opportunity to ask questions. Patient verbalized understanding of the plan and was able to repeat key elements of the plan. All questions were answered to their satisfaction.  Minette Brine, FNP   I, Minette Brine, FNP, have reviewed all documentation for this visit. The documentation on 09/26/21 for the exam, diagnosis, procedures, and orders are all accurate and complete.   IF YOU HAVE BEEN REFERRED TO A SPECIALIST, IT MAY TAKE 1-2 WEEKS TO SCHEDULE/PROCESS THE REFERRAL. IF YOU HAVE NOT HEARD FROM US/SPECIALIST IN TWO WEEKS, PLEASE GIVE [place] A CALL AT [phone].   THE PATIENT IS ENCOURAGED TO PRACTICE SOCIAL DISTANCING DUE TO THE COVID-19 PANDEMIC.

## 2021-09-26 NOTE — Patient Instructions (Signed)

## 2021-10-15 ENCOUNTER — Ambulatory Visit: Payer: 59

## 2021-10-15 ENCOUNTER — Other Ambulatory Visit: Payer: Self-pay

## 2021-10-15 VITALS — BP 118/80 | HR 91 | Temp 98.1°F | Ht 63.0 in | Wt 158.0 lb

## 2021-10-15 DIAGNOSIS — Z79899 Other long term (current) drug therapy: Secondary | ICD-10-CM

## 2021-10-15 NOTE — Progress Notes (Signed)
Pt here for BPC & to check BMP. She currently takes hydrochlorothiazide 12.5MG , she did take medication before coming to appointment. Denies headache, chest pain, blurred vision, dizziness.  BP Readings from Last 3 Encounters:  10/15/21 118/80  09/26/21 (!) 160/92  06/10/21 132/70  BP did look better today, pt will f/u with provider upcoming appointment.

## 2021-10-16 LAB — BMP8+EGFR
BUN/Creatinine Ratio: 14 (ref 9–23)
BUN: 13 mg/dL (ref 6–24)
CO2: 25 mmol/L (ref 20–29)
Calcium: 9.7 mg/dL (ref 8.7–10.2)
Chloride: 102 mmol/L (ref 96–106)
Creatinine, Ser: 0.91 mg/dL (ref 0.57–1.00)
Glucose: 85 mg/dL (ref 70–99)
Potassium: 4.6 mmol/L (ref 3.5–5.2)
Sodium: 141 mmol/L (ref 134–144)
eGFR: 79 mL/min/{1.73_m2} (ref >59)

## 2021-11-28 ENCOUNTER — Encounter: Payer: Self-pay | Admitting: Internal Medicine

## 2021-11-28 ENCOUNTER — Ambulatory Visit: Payer: 59 | Admitting: Internal Medicine

## 2021-11-28 ENCOUNTER — Other Ambulatory Visit: Payer: Self-pay

## 2021-11-28 VITALS — BP 118/70 | HR 86 | Temp 98.1°F | Ht 63.8 in | Wt 164.2 lb

## 2021-11-28 DIAGNOSIS — Z6828 Body mass index (BMI) 28.0-28.9, adult: Secondary | ICD-10-CM

## 2021-11-28 DIAGNOSIS — I10 Essential (primary) hypertension: Secondary | ICD-10-CM

## 2021-11-28 DIAGNOSIS — Z79899 Other long term (current) drug therapy: Secondary | ICD-10-CM

## 2021-11-28 MED ORDER — HYDROCHLOROTHIAZIDE 12.5 MG PO TABS: 12.5000 mg | ORAL_TABLET | Freq: Every day | ORAL | 1 refills | Status: DC

## 2021-11-28 NOTE — Patient Instructions (Addendum)

## 2021-11-28 NOTE — Progress Notes (Signed)
Rich Brave [place],acting as a Education administrator for Maximino Greenland, MD.,have documented all relevant documentation on the behalf of Maximino Greenland, MD,as directed by  Maximino Greenland, MD while in the presence of Maximino Greenland, MD.  This visit occurred during the SARS-CoV-2 public health emergency.  Safety protocols were in place, including screening questions prior to the visit, additional usage of staff PPE, and extensive cleaning of exam room while observing appropriate contact time as indicated for disinfecting solutions.  Subjective:     Patient ID: [name] , female    DOB: 08/19/1976 , 46 y.o.   MRN: [id number]   Chief Complaint  Patient presents with   Hypertension    HPI  She presents today for a BP Check. She reports compliance with meds. She has been taking HCTZ 12.13m daily without any issues. She denies headaches, chest pain and shortness of breath.     Hypertension This is a chronic problem. The current episode started more than 1 year ago. The problem has been gradually improving since onset. The problem is controlled. Pertinent negatives include no blurred vision, chest pain, palpitations or shortness of breath. The current treatment provides moderate improvement.    Past Medical History:  Diagnosis Date   Hx of colostomy      Family History  Problem Relation Age of Onset   Hyperlipidemia Mother    Cancer Maternal Grandmother    Hypertension Maternal Grandmother    Hypertension Maternal Grandfather    Heart disease Maternal Grandfather    Stroke Paternal Grandfather    Hypertension Paternal Grandfather    Heart attack Paternal Grandfather    Hypertension Father    Diabetes Father    Hypertension Paternal Grandmother      Current Outpatient Medications:    ferrous sulfate 325 (65 FE) MG tablet, Take 1 tablet (325 mg total) by mouth 2 (two) times daily with a meal., Disp: 60 tablet, Rfl: 3   VITAMIN D PO, Take 25 mcg by mouth. 1 per day, Disp: , Rfl:     hydrochlorothiazide (HYDRODIURIL) 12.5 MG tablet, Take 1 tablet (12.5 mg total) by mouth daily., Disp: 90 tablet, Rfl: 1   No Known Allergies   Review of Systems  Constitutional: Negative.   Eyes:  Negative for blurred vision.  Respiratory: Negative.  Negative for shortness of breath.   Cardiovascular: Negative.  Negative for chest pain and palpitations.  Neurological: Negative.   Psychiatric/Behavioral: Negative.      Today's Vitals   11/28/21 1534  BP: 118/70  Pulse: 86  Temp: 98.1 F (36.7 C)  Weight: 164 lb 3.2 oz (74.5 kg)  Height: 5' 3.8" (1.621 m)  PainSc: 0-No pain   Body mass index is 28.36 kg/m.  Wt Readings from Last 3 Encounters:  11/28/21 164 lb 3.2 oz (74.5 kg)  10/15/21 158 lb (71.7 kg)  09/26/21 162 lb 12.8 oz (73.8 kg)     BP Readings from Last 3 Encounters:  11/28/21 118/70  10/15/21 118/80  09/26/21 (!) 160/92     Objective:  Physical Exam Vitals and nursing note reviewed.  Constitutional:      Appearance: Normal appearance.  HENT:     Head: Normocephalic and atraumatic.  Cardiovascular:     Rate and Rhythm: Normal rate and regular rhythm.     Heart sounds: Normal heart sounds.  Pulmonary:     Effort: Pulmonary effort is normal.     Breath sounds: Normal breath sounds.  Skin:    General:  Skin is warm.  Neurological:     General: No focal deficit present.     Mental Status: She is alert.  Psychiatric:        Mood and Affect: Mood normal.        Behavior: Behavior normal.        Assessment And Plan:     1. Essential hypertension, benign Comments: Chronic, well controlled. No med changes. Advised to follow low sodium diet. She will f/u in 3-4 months for re-evaluation.  - hydrochlorothiazide (HYDRODIURIL) 12.5 MG tablet; Take 1 tablet (12.5 mg total) by mouth daily.  Dispense: 90 tablet; Refill: 1  2. BMI 28.0-28.9,adult Comments: She is encouraged to aim for at least 150 minutes of exercise per week.   3. Drug therapy Comments: I  will check labs as listed below.  - Magnesium - BMP8+eGFR   Patient was given opportunity to ask questions. Patient verbalized understanding of the plan and was able to repeat key elements of the plan. All questions were answered to their satisfaction.   I, Maximino Greenland, MD, have reviewed all documentation for this visit. The documentation on 11/30/21 for the exam, diagnosis, procedures, and orders are all accurate and complete.   IF YOU HAVE BEEN REFERRED TO A SPECIALIST, IT MAY TAKE 1-2 WEEKS TO SCHEDULE/PROCESS THE REFERRAL. IF YOU HAVE NOT HEARD FROM US/SPECIALIST IN TWO WEEKS, PLEASE GIVE [place] A CALL AT [phone].   THE PATIENT IS ENCOURAGED TO PRACTICE SOCIAL DISTANCING DUE TO THE COVID-19 PANDEMIC.

## 2021-12-12 ENCOUNTER — Other Ambulatory Visit: Payer: 59

## 2021-12-13 LAB — BMP8+EGFR
BUN/Creatinine Ratio: 17 (ref 9–23)
BUN: 17 mg/dL (ref 6–24)
CO2: 24 mmol/L (ref 20–29)
Calcium: 9.9 mg/dL (ref 8.7–10.2)
Chloride: 107 mmol/L — ABNORMAL HIGH (ref 96–106)
Creatinine, Ser: 1.03 mg/dL — ABNORMAL HIGH (ref 0.57–1.00)
Glucose: 89 mg/dL (ref 70–99)
Potassium: 4.6 mmol/L (ref 3.5–5.2)
Sodium: 144 mmol/L (ref 134–144)
eGFR: 68 mL/min/{1.73_m2} (ref >59)

## 2021-12-13 LAB — MAGNESIUM: Magnesium: 1.9 mg/dL (ref 1.6–2.3)

## 2021-12-16 ENCOUNTER — Ambulatory Visit: Payer: 59 | Admitting: Internal Medicine

## 2022-04-18 LAB — HM COLONOSCOPY

## 2022-04-21 ENCOUNTER — Other Ambulatory Visit: Payer: Self-pay

## 2022-04-21 ENCOUNTER — Encounter: Payer: Self-pay | Admitting: Internal Medicine

## 2022-04-21 DIAGNOSIS — I10 Essential (primary) hypertension: Secondary | ICD-10-CM

## 2022-04-21 MED ORDER — HYDROCHLOROTHIAZIDE 12.5 MG PO TABS
12.5000 mg | ORAL_TABLET | Freq: Every day | ORAL | 1 refills | Status: DC
Start: 2022-04-21 — End: 2022-06-16

## 2022-06-12 ENCOUNTER — Encounter: Payer: 59 | Admitting: Internal Medicine

## 2022-06-16 ENCOUNTER — Encounter: Payer: Self-pay | Admitting: Internal Medicine

## 2022-06-16 ENCOUNTER — Ambulatory Visit (INDEPENDENT_AMBULATORY_CARE_PROVIDER_SITE_OTHER): Payer: 59 | Admitting: Internal Medicine

## 2022-06-16 VITALS — BP 124/80 | HR 67 | Temp 98.0°F | Ht 63.8 in | Wt 164.4 lb

## 2022-06-16 DIAGNOSIS — I10 Essential (primary) hypertension: Secondary | ICD-10-CM | POA: Diagnosis not present

## 2022-06-16 DIAGNOSIS — Z0000 Encounter for general adult medical examination without abnormal findings: Secondary | ICD-10-CM

## 2022-06-16 DIAGNOSIS — Z6828 Body mass index (BMI) 28.0-28.9, adult: Secondary | ICD-10-CM | POA: Diagnosis not present

## 2022-06-16 MED ORDER — HYDROCHLOROTHIAZIDE 12.5 MG PO TABS: 12.5000 mg | ORAL_TABLET | Freq: Every day | ORAL | 1 refills | Status: DC

## 2022-06-16 NOTE — Patient Instructions (Signed)

## 2022-06-16 NOTE — Progress Notes (Deleted)
Rich Brave Llittleton,acting as a Education administrator for Maximino Greenland, MD.,have documented all relevant documentation on the behalf of Maximino Greenland, MD,as directed by  Maximino Greenland, MD while in the presence of Maximino Greenland, MD.   Subjective:     Patient ID: [name] , female    DOB: July 30, 1976 , 46 y.o.   MRN: [id number]   Chief Complaint  Patient presents with  . Annual Exam    HPI  Patient is here for physical exam. She is followed by [facility] for her gyn care. She is now followed by Earnstine Regal, PA.  Hypertension This is a chronic problem. The current episode started more than 1 year ago. The problem has been gradually improving since onset. The problem is controlled. Pertinent negatives include no blurred vision, chest pain, palpitations or shortness of breath. The current treatment provides moderate improvement.     Past Medical History:  Diagnosis Date  . Hx of colostomy      Family History  Problem Relation Age of Onset  . Hyperlipidemia Mother   . Cancer Maternal Grandmother   . Hypertension Maternal Grandmother   . Hypertension Maternal Grandfather   . Heart disease Maternal Grandfather   . Stroke Paternal Grandfather   . Hypertension Paternal Grandfather   . Heart attack Paternal Grandfather   . Hypertension Father   . Diabetes Father   . Hypertension Paternal Grandmother      Current Outpatient Medications:  .  ferrous sulfate 325 (65 FE) MG tablet, Take 1 tablet (325 mg total) by mouth 2 (two) times daily with a meal., Disp: 60 tablet, Rfl: 3 .  hydrochlorothiazide (HYDRODIURIL) 12.5 MG tablet, Take 1 tablet (12.5 mg total) by mouth daily., Disp: 90 tablet, Rfl: 1 .  VITAMIN D PO, Take 25 mcg by mouth. 1 per day, Disp: , Rfl:    No Known Allergies    The patient states she uses {contraceptive methods:5051} for birth control. Last LMP was No LMP recorded.. {Dysmenorrhea-menorrhagia:21918}. Negative for: breast discharge, breast lump(s), breast pain  and breast self exam. Associated symptoms include abnormal vaginal bleeding. Pertinent negatives include abnormal bleeding (hematology), anxiety, decreased libido, depression, difficulty falling sleep, dyspareunia, history of infertility, nocturia, sexual dysfunction, sleep disturbances, urinary incontinence, urinary urgency, vaginal discharge and vaginal itching. Diet regular.The patient states her exercise level is    . The patient's tobacco use is:  Social History   Tobacco Use  Smoking Status Former  . Years: 7.00  . Types: Cigarettes  Smokeless Tobacco Never  Tobacco Comments   1 -2 only when going out  . She has been exposed to passive smoke. The patient's alcohol use is:  Social History   Substance and Sexual Activity  Alcohol Use No  . Additional information: Last pap ***, next one scheduled for ***.    Review of Systems  Eyes:  Negative for blurred vision.  Respiratory:  Negative for shortness of breath.   Cardiovascular:  Negative for chest pain and palpitations.     There were no vitals filed for this visit. There is no height or weight on file to calculate BMI.   Objective:  Physical Exam      Assessment And Plan:     There are no diagnoses linked to this encounter.    Patient was given opportunity to ask questions. Patient verbalized understanding of the plan and was able to repeat key elements of the plan. All questions were answered to their satisfaction.   Curlene Dolphin  J Llittleton, CMA   I, Sheppard Evens Llittleton, CMA, have reviewed all documentation for this visit. The documentation on 06/16/22 for the exam, diagnosis, procedures, and orders are all accurate and complete.   THE PATIENT IS ENCOURAGED TO PRACTICE SOCIAL DISTANCING DUE TO THE COVID-19 PANDEMIC.

## 2022-06-16 NOTE — Progress Notes (Signed)
Rich Brave Llittleton,acting as a Education administrator for Maximino Greenland, MD.,have documented all relevant documentation on the behalf of Maximino Greenland, MD,as directed by  Maximino Greenland, MD while in the presence of Maximino Greenland, MD.   Subjective:     Patient ID: [name] , female    DOB: 1976/01/04 , 46 y.o.   MRN: [id number]   Chief Complaint  Patient presents with   Annual Exam    HPI  Patient is here for physical exam. She is followed by [facility] for her gyn care. She is now followed by Earnstine Regal, PA. She has yet to see her at her new office.   Hypertension This is a chronic problem. The current episode started more than 1 year ago. The problem has been gradually improving since onset. The problem is controlled. Pertinent negatives include no blurred vision, chest pain, palpitations or shortness of breath. The current treatment provides moderate improvement.     Past Medical History:  Diagnosis Date   Hx of colostomy      Family History  Problem Relation Age of Onset   Hyperlipidemia Mother    Cancer Maternal Grandmother    Hypertension Maternal Grandmother    Hypertension Maternal Grandfather    Heart disease Maternal Grandfather    Stroke Paternal Grandfather    Hypertension Paternal Grandfather    Heart attack Paternal Grandfather    Hypertension Father    Diabetes Father    Hypertension Paternal Grandmother      Current Outpatient Medications:    ferrous sulfate 325 (65 FE) MG tablet, Take 1 tablet (325 mg total) by mouth 2 (two) times daily with a meal., Disp: 60 tablet, Rfl: 3   VITAMIN D PO, Take 25 mcg by mouth. 1 per day, Disp: , Rfl:    hydrochlorothiazide (HYDRODIURIL) 12.5 MG tablet, Take 1 tablet (12.5 mg total) by mouth daily., Disp: 90 tablet, Rfl: 1   No Known Allergies    The patient states she uses none for birth control. Last LMP was Patient's last menstrual period was 06/02/2022.. Negative for Dysmenorrhea. Negative for: breast discharge,  breast lump(s), breast pain and breast self exam. Associated symptoms include abnormal vaginal bleeding. Pertinent negatives include abnormal bleeding (hematology), anxiety, decreased libido, depression, difficulty falling sleep, dyspareunia, history of infertility, nocturia, sexual dysfunction, sleep disturbances, urinary incontinence, urinary urgency, vaginal discharge and vaginal itching. Diet regular.The patient states her exercise level is  minimal.  . The patient's tobacco use is:  Social History   Tobacco Use  Smoking Status Former   Years: 7.00   Types: Cigarettes  Smokeless Tobacco Never  Tobacco Comments   1 -2 only when going out  . She has been exposed to passive smoke. The patient's alcohol use is:  Social History   Substance and Sexual Activity  Alcohol Use No    Review of Systems  Constitutional: Negative.   HENT: Negative.    Eyes: Negative.  Negative for blurred vision.  Respiratory: Negative.  Negative for shortness of breath.   Cardiovascular: Negative.  Negative for chest pain and palpitations.  Endocrine: Negative.   Genitourinary: Negative.   Musculoskeletal: Negative.   Skin: Negative.   Allergic/Immunologic: Negative.   Neurological: Negative.   Hematological: Negative.   Psychiatric/Behavioral: Negative.       Today's Vitals   06/16/22 1204  BP: 124/80  Pulse: 67  Temp: 98 F (36.7 C)  Weight: 164 lb 6.4 oz (74.6 kg)  Height: 5' 3.8" (1.621 m)  PainSc: 0-No pain   Body mass index is 28.4 kg/m.   BP Readings from Last 3 Encounters:  06/16/22 124/80  11/28/21 118/70  10/15/21 118/80    Objective:  Physical Exam Vitals and nursing note reviewed.  Constitutional:      Appearance: Normal appearance.  HENT:     Head: Normocephalic and atraumatic.     Right Ear: Tympanic membrane, ear canal and external ear normal.     Left Ear: Tympanic membrane, ear canal and external ear normal.     Nose: Nose normal.     Mouth/Throat:     Mouth:  Mucous membranes are moist.     Pharynx: Oropharynx is clear.  Eyes:     Extraocular Movements: Extraocular movements intact.     Conjunctiva/sclera: Conjunctivae normal.     Pupils: Pupils are equal, round, and reactive to light.  Cardiovascular:     Rate and Rhythm: Normal rate and regular rhythm.     Pulses:          Dorsalis pedis pulses are 3+ on the right side and 3+ on the left side.     Heart sounds: Normal heart sounds.  Pulmonary:     Effort: Pulmonary effort is normal.     Breath sounds: Normal breath sounds.  Abdominal:     General: Abdomen is flat. Bowel sounds are normal.     Palpations: Abdomen is soft.     Comments: Healed surgical scars  Genitourinary:    Comments: deferred Musculoskeletal:        General: Normal range of motion.     Cervical back: Normal range of motion and neck supple.  Skin:    General: Skin is warm and dry.     Comments: Scattered scarring on anterior chest, abdomen  Neurological:     General: No focal deficit present.     Mental Status: She is alert and oriented to person, place, and time.  Psychiatric:        Mood and Affect: Mood normal.        Behavior: Behavior normal.      Assessment And Plan:     1. Encounter for general adult medical examination w/o abnormal findings Comments: A full exam was performed. Importance of monthly self breast exams was discussed with the patient.  She is up to date with mammograms. I will refer her to GYN as requested. PATIENT IS ADVISED TO GET 30-45 MINUTES REGULAR EXERCISE NO LESS THAN FOUR TO FIVE DAYS PER WEEK - BOTH WEIGHTBEARING EXERCISES AND AEROBIC ARE RECOMMENDED.  PATIENT IS ADVISED TO FOLLOW A HEALTHY DIET WITH AT LEAST SIX FRUITS/VEGGIES PER DAY, DECREASE INTAKE OF RED MEAT, AND TO INCREASE FISH INTAKE TO TWO DAYS PER WEEK.  MEATS/FISH SHOULD NOT BE FRIED, BAKED OR BROILED IS PREFERABLE.  IT IS ALSO IMPORTANT TO CUT BACK ON YOUR SUGAR INTAKE. PLEASE AVOID ANYTHING WITH ADDED SUGAR, CORN SYRUP OR  OTHER SWEETENERS. IF YOU MUST USE A SWEETENER, YOU CAN TRY STEVIA. IT IS ALSO IMPORTANT TO AVOID ARTIFICIALLY SWEETENERS AND DIET BEVERAGES. LASTLY, I SUGGEST WEARING SPF 50 SUNSCREEN ON EXPOSED PARTS AND ESPECIALLY WHEN IN THE DIRECT SUNLIGHT FOR AN EXTENDED PERIOD OF TIME.  PLEASE AVOID FAST FOOD RESTAURANTS AND INCREASE YOUR WATER INTAKE.  - Ambulatory referral to Gynecology  2. Essential hypertension, benign Comments: Chronic, well controlled. She will c/w HCTZ 12.'5mg'$  daily. EKG performed, NSR w/ right axis, rightward axis-no new changes. She will rto 6 months. - EKG 12-Lead - hydrochlorothiazide (  HYDRODIURIL) 12.5 MG tablet; Take 1 tablet (12.5 mg total) by mouth daily.  Dispense: 90 tablet; Refill: 1  3. BMI 28.0-28.9,adult Comments: She is encouraged to aim for at least 150 minutes of exercise per week.   Patient was given opportunity to ask questions. Patient verbalized understanding of the plan and was able to repeat key elements of the plan. All questions were answered to their satisfaction.   I, Maximino Greenland, MD, have reviewed all documentation for this visit. The documentation on 06/16/22 for the exam, diagnosis, procedures, and orders are all accurate and complete.   THE PATIENT IS ENCOURAGED TO PRACTICE SOCIAL DISTANCING DUE TO THE COVID-19 PANDEMIC.

## 2022-06-17 LAB — CMP14+EGFR
ALT: 11 IU/L (ref 0–32)
AST: 8 IU/L (ref 0–40)
Albumin/Globulin Ratio: 1.2 (ref 1.2–2.2)
Albumin: 4.1 g/dL (ref 3.9–4.9)
Alkaline Phosphatase: 94 IU/L (ref 44–121)
BUN/Creatinine Ratio: 15 (ref 9–23)
BUN: 13 mg/dL (ref 6–24)
Bilirubin Total: 0.5 mg/dL (ref 0.0–1.2)
CO2: 21 mmol/L (ref 20–29)
Calcium: 9.3 mg/dL (ref 8.7–10.2)
Chloride: 100 mmol/L (ref 96–106)
Creatinine, Ser: 0.89 mg/dL (ref 0.57–1.00)
Globulin, Total: 3.3 g/dL (ref 1.5–4.5)
Glucose: 78 mg/dL (ref 70–99)
Potassium: 3.9 mmol/L (ref 3.5–5.2)
Sodium: 138 mmol/L (ref 134–144)
Total Protein: 7.4 g/dL (ref 6.0–8.5)
eGFR: 81 mL/min/{1.73_m2} (ref >59)

## 2022-06-17 LAB — CBC
Hematocrit: 44.6 % (ref 34.0–46.6)
Hemoglobin: 14.5 g/dL (ref 11.1–15.9)
MCH: 29.6 pg (ref 26.6–33.0)
MCHC: 32.5 g/dL (ref 31.5–35.7)
MCV: 91 fL (ref 79–97)
Platelets: 231 10*3/uL (ref 150–450)
RBC: 4.9 x10E6/uL (ref 3.77–5.28)
RDW: 13.4 % (ref 11.7–15.4)
WBC: 9.5 10*3/uL (ref 3.4–10.8)

## 2022-06-17 LAB — LIPID PANEL
Chol/HDL Ratio: 2.8 ratio (ref 0.0–4.4)
Cholesterol, Total: 185 mg/dL (ref 100–199)
HDL: 65 mg/dL (ref >39)
LDL Chol Calc (NIH): 107 mg/dL — ABNORMAL HIGH (ref 0–99)
Triglycerides: 69 mg/dL (ref 0–149)
VLDL Cholesterol Cal: 13 mg/dL (ref 5–40)

## 2022-10-15 ENCOUNTER — Other Ambulatory Visit: Payer: Self-pay | Admitting: Internal Medicine

## 2022-10-15 DIAGNOSIS — I10 Essential (primary) hypertension: Secondary | ICD-10-CM

## 2023-06-19 ENCOUNTER — Ambulatory Visit (INDEPENDENT_AMBULATORY_CARE_PROVIDER_SITE_OTHER): Payer: 59 | Admitting: Internal Medicine

## 2023-06-19 ENCOUNTER — Encounter: Payer: Self-pay | Admitting: Internal Medicine

## 2023-06-19 VITALS — BP 126/82 | HR 82 | Temp 98.1°F | Ht 63.0 in | Wt 158.0 lb

## 2023-06-19 DIAGNOSIS — I10 Essential (primary) hypertension: Secondary | ICD-10-CM

## 2023-06-19 DIAGNOSIS — Z0000 Encounter for general adult medical examination without abnormal findings: Secondary | ICD-10-CM | POA: Diagnosis not present

## 2023-06-19 LAB — POCT URINALYSIS DIPSTICK
Bilirubin, UA: NEGATIVE
Glucose, UA: NEGATIVE
Ketones, UA: NEGATIVE
Nitrite, UA: NEGATIVE
Protein, UA: NEGATIVE
Spec Grav, UA: 1.025 (ref 1.010–1.025)
Urobilinogen, UA: 0.2 E.U./dL
pH, UA: 7 (ref 5.0–8.0)

## 2023-06-19 MED ORDER — HYDROCHLOROTHIAZIDE 12.5 MG PO TABS
12.5000 mg | ORAL_TABLET | Freq: Every day | ORAL | 2 refills | Status: DC
Start: 2023-06-19 — End: 2024-06-23

## 2023-06-19 NOTE — Progress Notes (Signed)
I,Jameka J Llittleton, CMA,acting as a Neurosurgeon for Gwynneth Aliment, MD.,have documented all relevant documentation on the behalf of Gwynneth Aliment, MD,as directed by  Gwynneth Aliment, MD while in the presence of Gwynneth Aliment, MD.  Subjective:    Patient ID: [name] , female    DOB: 1975-11-13 , 47 y.o.   MRN: [id number]  Chief Complaint  Patient presents with   Annual Exam   Hypertension    HPI  Patient is here for physical exam. She is followed by Tobey Grim at [facility] for her GYN care. She reports compliance with meds. She denies having any questions or concerns at this time.   Letter sent to [facility] for her pap smear.   Hypertension This is a chronic problem. The current episode started more than 1 year ago. The problem has been gradually improving since onset. The problem is controlled. Pertinent negatives include no blurred vision. The current treatment provides moderate improvement.     Past Medical History:  Diagnosis Date   Hx of colostomy      Family History  Problem Relation Age of Onset   Hyperlipidemia Mother    Cancer Maternal Grandmother    Hypertension Maternal Grandmother    Hypertension Maternal Grandfather    Heart disease Maternal Grandfather    Stroke Paternal Grandfather    Hypertension Paternal Grandfather    Heart attack Paternal Grandfather    Hypertension Father    Diabetes Father    Hypertension Paternal Grandmother      Current Outpatient Medications:    ferrous sulfate 325 (65 FE) MG tablet, Take 1 tablet (325 mg total) by mouth 2 (two) times daily with a meal. (Patient not taking: Reported on 06/19/2023), Disp: 60 tablet, Rfl: 3   hydrochlorothiazide (HYDRODIURIL) 12.5 MG tablet, Take 1 tablet (12.5 mg total) by mouth daily. TAKE 1 TABLET(12.5 MG) BY MOUTH DAILY, Disp: 90 tablet, Rfl: 2   VITAMIN D PO, Take 25 mcg by mouth. 1 per day (Patient not taking: Reported on 06/19/2023), Disp: , Rfl:    No Known  Allergies    The patient states she uses none for birth control. Patient's last menstrual period was 06/15/2023 (exact date).. Negative for Dysmenorrhea. Negative for: breast discharge, breast lump(s), breast pain and breast self exam. Associated symptoms include abnormal vaginal bleeding. Pertinent negatives include abnormal bleeding (hematology), anxiety, decreased libido, depression, difficulty falling sleep, dyspareunia, history of infertility, nocturia, sexual dysfunction, sleep disturbances, urinary incontinence, urinary urgency, vaginal discharge and vaginal itching. Diet regular.The patient states her exercise level is  moderate.  . The patient's tobacco use is:  Social History   Tobacco Use  Smoking Status Former   Types: Cigarettes  Smokeless Tobacco Never  Tobacco Comments   1 -2 only when going out  . She has been exposed to passive smoke. The patient's alcohol use is:  Social History   Substance and Sexual Activity  Alcohol Use No    Review of Systems  Constitutional: Negative.   HENT: Negative.    Eyes: Negative.  Negative for blurred vision.  Respiratory: Negative.    Cardiovascular: Negative.   Gastrointestinal: Negative.   Endocrine: Negative.   Genitourinary: Negative.   Musculoskeletal: Negative.   Skin: Negative.   Allergic/Immunologic: Negative.   Neurological: Negative.   Hematological: Negative.   Psychiatric/Behavioral: Negative.       Today's Vitals   06/19/23 1342  BP: 126/82  Pulse: 82  Temp: 98.1 F (36.7 C)  SpO2: 98%  Weight: 158 lb (71.7 kg)  Height: 5\' 3"  (1.6 m)   Body mass index is 27.99 kg/m.  Wt Readings from Last 3 Encounters:  06/19/23 158 lb (71.7 kg)  06/16/22 164 lb 6.4 oz (74.6 kg)  11/28/21 164 lb 3.2 oz (74.5 kg)    The 10-year ASCVD risk score (Arnett DK, et al., 2019) is: 1.7%   Values used to calculate the score:     Age: 53 years     Sex: Female     Is Non-Hispanic African American: Yes     Diabetic: No      Tobacco smoker: No     Systolic Blood Pressure: 126 mmHg     Is BP treated: Yes     HDL Cholesterol: 72 mg/dL     Total Cholesterol: 209 mg/dL   Objective:  Physical Exam Vitals and nursing note reviewed.  Constitutional:      Appearance: Normal appearance.  HENT:     Head: Normocephalic and atraumatic.     Right Ear: Tympanic membrane, ear canal and external ear normal.     Left Ear: Tympanic membrane, ear canal and external ear normal.     Nose: Nose normal.     Mouth/Throat:     Mouth: Mucous membranes are moist.     Pharynx: Oropharynx is clear.  Eyes:     Extraocular Movements: Extraocular movements intact.     Conjunctiva/sclera: Conjunctivae normal.     Pupils: Pupils are equal, round, and reactive to light.  Cardiovascular:     Rate and Rhythm: Normal rate and regular rhythm.     Pulses: Normal pulses.     Heart sounds: Normal heart sounds.  Pulmonary:     Effort: Pulmonary effort is normal.     Breath sounds: Normal breath sounds.  Chest:  Breasts:    Tanner Score is 5.     Right: Normal.     Left: Normal.     Comments: Keloid anterior chest Abdominal:     General: Abdomen is flat. Bowel sounds are normal.     Palpations: Abdomen is soft.  Genitourinary:    Comments: deferred Musculoskeletal:        General: Normal range of motion.     Cervical back: Normal range of motion and neck supple.  Skin:    General: Skin is warm and dry.     Comments: scarring  Neurological:     General: No focal deficit present.     Mental Status: She is alert and oriented to person, place, and time.  Psychiatric:        Mood and Affect: Mood normal.        Behavior: Behavior normal.         Assessment And Plan:     Encounter for general adult medical examination w/o abnormal findings Assessment & Plan: A full exam was performed.  Importance of monthly self breast exams was discussed with the patient.  She is advised to get 30-45 minutes of regular exercise, no less  than four to five days per week. Both weight-bearing and aerobic exercises are recommended.  She is advised to follow a healthy diet with at least six fruits/veggies per day, decrease intake of red meat and other saturated fats and to increase fish intake to twice weekly.  Meats/fish should not be fried -- baked, boiled or broiled is preferable. It is also important to cut back on your sugar intake.  Be sure to read labels -  try to avoid anything with added sugar, high fructose corn syrup or other sweeteners.  If you must use a sweetener, you can try stevia or monkfruit.  It is also important to avoid artificially sweetened foods/beverages and diet drinks. Lastly, wear SPF 50 sunscreen on exposed skin and when in direct sunlight for an extended period of time.  Be sure to avoid fast food restaurants and aim for at least 60 ounces of water daily.      Orders: -     CBC -     Lipid panel -     CMP14+EGFR -     TSH  Essential hypertension, benign Assessment & Plan: Chronic, fair control. Goal BP<120/80.  EKG performed, NSR w/ RAE.  She will continue with hydrochlorothiazide 12.5mg  daily. Advised to follow low sodium diet. She will f/u in six months for re-evaluation.    Orders: -     POCT urinalysis dipstick -     Microalbumin / creatinine urine ratio -     EKG 12-Lead -     hydroCHLOROthiazide; Take 1 tablet (12.5 mg total) by mouth daily. TAKE 1 TABLET(12.5 MG) BY MOUTH DAILY  Dispense: 90 tablet; Refill: 2     Return for 1 year physical, 6 month bp. Patient was given opportunity to ask questions. Patient verbalized understanding of the plan and was able to repeat key elements of the plan. All questions were answered to their satisfaction.   I, Gwynneth Aliment, MD, have reviewed all documentation for this visit. The documentation on 06/19/23 for the exam, diagnosis, procedures, and orders are all accurate and complete.

## 2023-06-19 NOTE — Patient Instructions (Signed)

## 2023-06-23 ENCOUNTER — Encounter: Payer: 59 | Admitting: Internal Medicine

## 2023-06-27 NOTE — Assessment & Plan Note (Signed)
Chronic, fair control. Goal BP<120/80.  EKG performed, NSR w/ RAE.  She will continue with hydrochlorothiazide 12.5mg  daily. Advised to follow low sodium diet. She will f/u in six months for re-evaluation.

## 2023-06-27 NOTE — Assessment & Plan Note (Signed)

## 2023-07-20 LAB — HM PAP SMEAR: HPV, high-risk: NEGATIVE

## 2023-07-20 LAB — HM MAMMOGRAPHY

## 2024-05-18 ENCOUNTER — Other Ambulatory Visit: Payer: Self-pay | Admitting: Obstetrics and Gynecology

## 2024-05-18 DIAGNOSIS — Z1231 Encounter for screening mammogram for malignant neoplasm of breast: Secondary | ICD-10-CM

## 2024-06-23 ENCOUNTER — Encounter: Payer: Self-pay | Admitting: Internal Medicine

## 2024-06-23 ENCOUNTER — Ambulatory Visit (INDEPENDENT_AMBULATORY_CARE_PROVIDER_SITE_OTHER): Payer: Self-pay | Admitting: Internal Medicine

## 2024-06-23 VITALS — BP 130/82 | HR 82 | Temp 98.2°F | Ht 63.0 in | Wt 179.6 lb

## 2024-06-23 DIAGNOSIS — Z0000 Encounter for general adult medical examination without abnormal findings: Secondary | ICD-10-CM | POA: Diagnosis not present

## 2024-06-23 DIAGNOSIS — E66811 Obesity, class 1: Secondary | ICD-10-CM | POA: Diagnosis not present

## 2024-06-23 DIAGNOSIS — E559 Vitamin D deficiency, unspecified: Secondary | ICD-10-CM | POA: Diagnosis not present

## 2024-06-23 DIAGNOSIS — Z6831 Body mass index (BMI) 31.0-31.9, adult: Secondary | ICD-10-CM

## 2024-06-23 DIAGNOSIS — I10 Essential (primary) hypertension: Secondary | ICD-10-CM | POA: Diagnosis not present

## 2024-06-23 DIAGNOSIS — E6609 Other obesity due to excess calories: Secondary | ICD-10-CM

## 2024-06-23 LAB — POCT URINALYSIS DIP (CLINITEK)
Bilirubin, UA: NEGATIVE
Blood, UA: NEGATIVE
Glucose, UA: NEGATIVE mg/dL
Ketones, POC UA: NEGATIVE mg/dL
Nitrite, UA: POSITIVE — AB
POC PROTEIN,UA: NEGATIVE
Spec Grav, UA: 1.02 (ref 1.010–1.025)
Urobilinogen, UA: 0.2 U/dL
pH, UA: 7 (ref 5.0–8.0)

## 2024-06-23 MED ORDER — HYDROCHLOROTHIAZIDE 12.5 MG PO TABS: 12.5000 mg | ORAL_TABLET | Freq: Every day | ORAL | 2 refills | Status: DC

## 2024-06-23 NOTE — Progress Notes (Signed)
 I,Victoria T Emmitt, CMA,acting as a Neurosurgeon for Catheryn LOISE Slocumb, MD.,have documented all relevant documentation on the behalf of Catheryn LOISE Slocumb, MD,as directed by  Catheryn LOISE Slocumb, MD while in the presence of Catheryn LOISE Slocumb, MD.  Subjective:    Patient ID: [name] , female    DOB: [birth year] , 48 y.o.   MRN: [id number]  Chief Complaint  Patient presents with   Annual Exam    Patient presents today for annual exam. She reports compliance with medications. Denies headache, chest pain & sob. GYN: Madolyn Monte    Hypertension    HPI Discussed the use of AI scribe software for clinical note transcription with the patient, who gave verbal consent to proceed.  History of Present Illness [name] is a 48 year old female who presents for a routine physical exam and blood pressure check.  She has not had her mammogram for this year, which is typically scheduled around September with her physical exam. Her last mammogram was in 2021 at [facility].  She had a colonoscopy two years ago, during which polyps were found. She believes her next colonoscopy is due in five years.  She does not typically receive flu or COVID-19 vaccinations.  She is currently taking hydrochlorothiazide  and over-the-counter vitamin D . She drinks water daily using a thermos but is unsure of the exact amount in ounces.  She exercises about once a week and describes her bowel movements as 'pretty good' with no issues.  Family history is significant for breast cancer on her mother's side, affecting her grandmother and great-grandmother. There is no family history of heart disease or colon cancer.  She recently took a trip to [place] with her son, which involved a lot of walking. Her son is 61 years old and in the fourth grade.   Hypertension This is a chronic problem. The current episode started more than 1 year ago. The problem has been gradually improving since onset. The problem is controlled.  Pertinent negatives include no blurred vision. Risk factors for coronary artery disease include obesity. Past treatments include diuretics. The current treatment provides moderate improvement. Compliance problems include exercise.      Past Medical History:  Diagnosis Date   Hx of colostomy      Family History  Problem Relation Age of Onset   Hyperlipidemia Mother    Cancer Maternal Grandmother    Hypertension Maternal Grandmother    Hypertension Maternal Grandfather    Heart disease Maternal Grandfather    Stroke Paternal Grandfather    Hypertension Paternal Grandfather    Heart attack Paternal Grandfather    Hypertension Father    Diabetes Father    Hypertension Paternal Grandmother      Current Outpatient Medications:    ferrous sulfate  325 (65 FE) MG tablet, Take 1 tablet (325 mg total) by mouth 2 (two) times daily with a meal. (Patient not taking: Reported on 06/23/2024), Disp: 60 tablet, Rfl: 3   hydrochlorothiazide  (HYDRODIURIL ) 12.5 MG tablet, Take 1 tablet (12.5 mg total) by mouth daily. TAKE 1 TABLET(12.5 MG) BY MOUTH DAILY, Disp: 90 tablet, Rfl: 2   nitrofurantoin , macrocrystal-monohydrate, (MACROBID ) 100 MG capsule, Take 1 capsule (100 mg total) by mouth 2 (two) times daily for 5 days., Disp: 10 capsule, Rfl: 0   VITAMIN D  PO, Take 25 mcg by mouth. 1 per day (Patient not taking: Reported on 06/23/2024), Disp: , Rfl:    No Known Allergies    The patient states she uses none for birth control. No  LMP recorded (exact date).. Negative for Dysmenorrhea. Negative for: breast discharge, breast lump(s), breast pain and breast self exam. Associated symptoms include abnormal vaginal bleeding. Pertinent negatives include abnormal bleeding (hematology), anxiety, decreased libido, depression, difficulty falling sleep, dyspareunia, history of infertility, nocturia, sexual dysfunction, sleep disturbances, urinary incontinence, urinary urgency, vaginal discharge and vaginal itching. Diet  regular.The patient states her exercise level is  intermittent.  . The patient's tobacco use is:  Social History   Tobacco Use  Smoking Status Former   Types: Cigarettes  Smokeless Tobacco Never  Tobacco Comments   1 -2 only when going out  . She has been exposed to passive smoke. The patient's alcohol use is:  Social History   Substance and Sexual Activity  Alcohol Use No    Review of Systems  Constitutional: Negative.   HENT: Negative.    Eyes: Negative.  Negative for blurred vision.  Respiratory: Negative.    Cardiovascular: Negative.   Gastrointestinal: Negative.   Endocrine: Negative.   Genitourinary: Negative.   Musculoskeletal: Negative.   Skin: Negative.   Allergic/Immunologic: Negative.   Neurological: Negative.   Hematological: Negative.   Psychiatric/Behavioral: Negative.       Today's Vitals   06/23/24 1151  BP: 130/82  Pulse: 82  Temp: 98.2 F (36.8 C)  SpO2: 98%  Weight: 179 lb 9.6 oz (81.5 kg)  Height: 5' 3 (1.6 m)   Body mass index is 31.81 kg/m.  Wt Readings from Last 3 Encounters:  06/23/24 179 lb 9.6 oz (81.5 kg)  06/19/23 158 lb (71.7 kg)  06/16/22 164 lb 6.4 oz (74.6 kg)     Objective:  Physical Exam Vitals and nursing note reviewed.  Constitutional:      Appearance: Normal appearance.  HENT:     Head: Normocephalic and atraumatic.     Right Ear: Tympanic membrane, ear canal and external ear normal.     Left Ear: Tympanic membrane, ear canal and external ear normal.     Nose: Nose normal.     Mouth/Throat:     Mouth: Mucous membranes are moist.     Pharynx: Oropharynx is clear.  Eyes:     Extraocular Movements: Extraocular movements intact.     Conjunctiva/sclera: Conjunctivae normal.     Pupils: Pupils are equal, round, and reactive to light.  Cardiovascular:     Rate and Rhythm: Normal rate and regular rhythm.     Pulses: Normal pulses.     Heart sounds: Normal heart sounds.  Pulmonary:     Effort: Pulmonary effort is  normal.     Breath sounds: Normal breath sounds.  Chest:  Breasts:    Tanner Score is 5.     Right: Normal.     Left: Normal.     Comments: Keloid anterior chest Abdominal:     General: Abdomen is flat. Bowel sounds are normal.     Palpations: Abdomen is soft.  Genitourinary:    Comments: deferred Musculoskeletal:        General: Normal range of motion.     Cervical back: Normal range of motion and neck supple.  Skin:    General: Skin is warm and dry.     Comments: scarring  Neurological:     General: No focal deficit present.     Mental Status: She is alert and oriented to person, place, and time.  Psychiatric:        Mood and Affect: Mood normal.        Behavior: Behavior  normal.         Assessment And Plan:     Encounter for general adult medical examination w/o abnormal findings Assessment & Plan: A full exam was performed.  Importance of monthly self breast exams was discussed with the patient.  She is advised to get 30-45 minutes of regular exercise, no less than four to five days per week. Both weight-bearing and aerobic exercises are recommended.  She is advised to follow a healthy diet with at least six fruits/veggies per day, decrease intake of red meat and other saturated fats and to increase fish intake to twice weekly.  Meats/fish should not be fried -- baked, boiled or broiled is preferable. It is also important to cut back on your sugar intake.  Be sure to read labels - try to avoid anything with added sugar, high fructose corn syrup or other sweeteners.  If you must use a sweetener, you can try stevia or monkfruit.  It is also important to avoid artificially sweetened foods/beverages and diet drinks. Lastly, wear SPF 50 sunscreen on exposed skin and when in direct sunlight for an extended period of time.  Be sure to avoid fast food restaurants and aim for at least 60 ounces of water daily.      Orders: -     CBC -     CMP14+EGFR -     Lipid panel  Essential  hypertension, benign Assessment & Plan: Chronic, fair control. Goal BP<130/80.  EKG performed, NSR w/ right axis and nonspecific T-abnormality. Hypertension managed with hydrochlorothiazide . - Continue hydrochlorothiazide . - Send prescription for hydrochlorothiazide  to pharmacy - Follow up in six months   Orders: -     POCT URINALYSIS DIP (CLINITEK) -     Microalbumin / creatinine urine ratio -     EKG 12-Lead -     hydroCHLOROthiazide ; Take 1 tablet (12.5 mg total) by mouth daily. TAKE 1 TABLET(12.5 MG) BY MOUTH DAILY  Dispense: 90 tablet; Refill: 2  Vitamin D  deficiency disease Assessment & Plan: I WILL CHECK A VIT D LEVEL AND SUPPLEMENT AS NEEDED.  ALSO ENCOURAGED TO SPEND 15 MINUTES IN THE SUN DAILY.   Orders: -     VITAMIN D  25 Hydroxy (Vit-D Deficiency, Fractures)  Class 1 obesity due to excess calories with serious comorbidity and body mass index (BMI) of 31.0 to 31.9 in adult Assessment & Plan: She is encouraged to strive for BMI less than 30 to decrease cardiac risk. Advised to aim for at least 150 minutes of exercise per week.     Return for 1 year physical, 6 month bp. Patient was given opportunity to ask questions. Patient verbalized understanding of the plan and was able to repeat key elements of the plan. All questions were answered to their satisfaction.   I, Catheryn LOISE Slocumb, MD, have reviewed all documentation for this visit. The documentation on 06/23/24 for the exam, diagnosis, procedures, and orders are all accurate and complete.

## 2024-06-23 NOTE — Patient Instructions (Signed)

## 2024-06-24 ENCOUNTER — Ambulatory Visit: Payer: Self-pay | Admitting: Internal Medicine

## 2024-06-24 ENCOUNTER — Other Ambulatory Visit: Payer: Self-pay | Admitting: Internal Medicine

## 2024-06-24 MED ORDER — NITROFURANTOIN MONOHYD MACRO 100 MG PO CAPS: 100.0000 mg | ORAL_CAPSULE | Freq: Two times a day (BID) | ORAL | 0 refills | Status: AC

## 2024-06-25 LAB — CBC
Hematocrit: 47.5 % — ABNORMAL HIGH (ref 34.0–46.6)
Hemoglobin: 15.3 g/dL (ref 11.1–15.9)
MCH: 30.7 pg (ref 26.6–33.0)
MCHC: 32.2 g/dL (ref 31.5–35.7)
MCV: 95 fL (ref 79–97)
Platelets: 240 x10E3/uL (ref 150–450)
RBC: 4.99 x10E6/uL (ref 3.77–5.28)
RDW: 11.9 % (ref 11.7–15.4)
WBC: 7.9 x10E3/uL (ref 3.4–10.8)

## 2024-06-25 LAB — CMP14+EGFR
ALT: 14 IU/L (ref 0–32)
AST: 23 IU/L (ref 0–40)
Albumin: 4.2 g/dL (ref 3.9–4.9)
Alkaline Phosphatase: 100 IU/L (ref 44–121)
BUN/Creatinine Ratio: 16 (ref 9–23)
BUN: 16 mg/dL (ref 6–24)
Bilirubin Total: 0.4 mg/dL (ref 0.0–1.2)
CO2: 22 mmol/L (ref 20–29)
Calcium: 10 mg/dL (ref 8.7–10.2)
Chloride: 99 mmol/L (ref 96–106)
Creatinine, Ser: 0.97 mg/dL (ref 0.57–1.00)
Globulin, Total: 3.2 g/dL (ref 1.5–4.5)
Glucose: 73 mg/dL (ref 70–99)
Potassium: 4.4 mmol/L (ref 3.5–5.2)
Sodium: 141 mmol/L (ref 134–144)
Total Protein: 7.4 g/dL (ref 6.0–8.5)
eGFR: 72 mL/min/1.73 (ref >59)

## 2024-06-25 LAB — LIPID PANEL
Chol/HDL Ratio: 3.4 ratio (ref 0.0–4.4)
Cholesterol, Total: 220 mg/dL — ABNORMAL HIGH (ref 100–199)
HDL: 64 mg/dL (ref >39)
LDL Chol Calc (NIH): 129 mg/dL — ABNORMAL HIGH (ref 0–99)
Triglycerides: 152 mg/dL — ABNORMAL HIGH (ref 0–149)
VLDL Cholesterol Cal: 27 mg/dL (ref 5–40)

## 2024-06-25 LAB — VITAMIN D 25 HYDROXY (VIT D DEFICIENCY, FRACTURES): Vit D, 25-Hydroxy: 37.1 ng/mL (ref 30.0–100.0)

## 2024-06-25 LAB — MICROALBUMIN / CREATININE URINE RATIO
Creatinine, Urine: 79.1 mg/dL
Microalb/Creat Ratio: 45 mg/g{creat} — ABNORMAL HIGH (ref 0–29)
Microalbumin, Urine: 35.5 ug/mL

## 2024-06-26 DIAGNOSIS — E6609 Other obesity due to excess calories: Secondary | ICD-10-CM | POA: Insufficient documentation

## 2024-06-26 DIAGNOSIS — E559 Vitamin D deficiency, unspecified: Secondary | ICD-10-CM | POA: Insufficient documentation

## 2024-06-26 NOTE — Assessment & Plan Note (Signed)
 She is encouraged to strive for BMI less than 30 to decrease cardiac risk. Advised to aim for at least 150 minutes of exercise per week.

## 2024-06-26 NOTE — Assessment & Plan Note (Signed)
 I WILL CHECK A VIT D LEVEL AND SUPPLEMENT AS NEEDED.  ALSO ENCOURAGED TO SPEND 15 MINUTES IN THE SUN DAILY.

## 2024-06-26 NOTE — Assessment & Plan Note (Signed)

## 2024-06-26 NOTE — Assessment & Plan Note (Signed)
 Chronic, fair control. Goal BP<130/80.  EKG performed, NSR w/ right axis and nonspecific T-abnormality. Hypertension managed with hydrochlorothiazide . - Continue hydrochlorothiazide . - Send prescription for hydrochlorothiazide  to pharmacy - Follow up in six months

## 2024-07-27 ENCOUNTER — Other Ambulatory Visit: Payer: Self-pay | Admitting: Internal Medicine

## 2024-07-27 DIAGNOSIS — I10 Essential (primary) hypertension: Secondary | ICD-10-CM

## 2024-08-09 ENCOUNTER — Other Ambulatory Visit: Payer: Self-pay | Admitting: Obstetrics and Gynecology

## 2024-08-09 DIAGNOSIS — Z1231 Encounter for screening mammogram for malignant neoplasm of breast: Secondary | ICD-10-CM

## 2024-12-27 ENCOUNTER — Ambulatory Visit: Payer: Self-pay | Admitting: Internal Medicine

## 2025-07-03 ENCOUNTER — Encounter: Payer: Self-pay | Admitting: Internal Medicine
